# Patient Record
Sex: Female | Born: 1950 | Race: White | Marital: Married | Smoking: Never smoker
Health system: Northeastern US, Academic
[De-identification: ages and names within clinical notes are randomized; demographics above are authoritative.]

---

## 1998-04-07 DIAGNOSIS — C50919 Malignant neoplasm of unspecified site of unspecified female breast: Secondary | ICD-10-CM

## 1998-04-07 HISTORY — DX: Malignant neoplasm of unspecified site of unspecified female breast: C50.919

## 1998-04-07 HISTORY — PX: MASTECTOMY, MODIFIED RADICAL: SHX708

## 1998-04-07 HISTORY — PX: OTHER SURGICAL HISTORY: SHX169

## 2011-07-07 DIAGNOSIS — M069 Rheumatoid arthritis, unspecified: Secondary | ICD-10-CM

## 2011-07-07 HISTORY — DX: Rheumatoid arthritis, unspecified: M06.9

## 2011-07-28 DIAGNOSIS — M25469 Effusion, unspecified knee: Secondary | ICD-10-CM | POA: Insufficient documentation

## 2011-08-11 DIAGNOSIS — M069 Rheumatoid arthritis, unspecified: Secondary | ICD-10-CM | POA: Insufficient documentation

## 2011-08-11 DIAGNOSIS — Z92241 Personal history of systemic steroid therapy: Secondary | ICD-10-CM | POA: Insufficient documentation

## 2011-08-11 DIAGNOSIS — M1991 Primary osteoarthritis, unspecified site: Secondary | ICD-10-CM | POA: Insufficient documentation

## 2011-10-13 DIAGNOSIS — R69 Illness, unspecified: Secondary | ICD-10-CM | POA: Insufficient documentation

## 2011-10-13 DIAGNOSIS — M2550 Pain in unspecified joint: Secondary | ICD-10-CM | POA: Insufficient documentation

## 2011-11-17 DIAGNOSIS — R5382 Chronic fatigue, unspecified: Secondary | ICD-10-CM | POA: Insufficient documentation

## 2012-04-07 HISTORY — PX: KNEE ARTHROSCOPY: SHX127

## 2012-11-15 DIAGNOSIS — M810 Age-related osteoporosis without current pathological fracture: Secondary | ICD-10-CM | POA: Insufficient documentation

## 2015-07-16 ENCOUNTER — Encounter: Payer: Self-pay | Admitting: Podiatry

## 2015-07-16 ENCOUNTER — Ambulatory Visit: Payer: Self-pay | Admitting: Podiatry

## 2015-07-16 VITALS — Ht 67.0 in | Wt 150.0 lb

## 2015-07-16 DIAGNOSIS — M21611 Bunion of right foot: Secondary | ICD-10-CM

## 2015-07-16 DIAGNOSIS — M069 Rheumatoid arthritis, unspecified: Secondary | ICD-10-CM

## 2015-07-16 DIAGNOSIS — M2041 Other hammer toe(s) (acquired), right foot: Secondary | ICD-10-CM

## 2015-07-17 ENCOUNTER — Telehealth: Payer: Self-pay | Admitting: Podiatry

## 2015-07-17 NOTE — Telephone Encounter (Signed)
Please return patient call - she phoned to let you know she has cleared it with her other dr's that if she stops her medication for 1 week before surgery she is fine. Patient wants surgery 08/01/15. She would like you to confirm that date is still good so she can make plans for meds.  Thanks - jd

## 2015-07-18 ENCOUNTER — Ambulatory Visit: Payer: Self-pay | Admitting: Podiatry

## 2015-07-19 ENCOUNTER — Encounter: Payer: Self-pay | Admitting: Podiatry

## 2015-07-19 NOTE — Procedures (Signed)
Bone density appears within normal limits.  There is evidence of a bunion deformity with increased IM angle and lateral deviation of the hallux.  There are no significant arthritic changes noted in this area at this time.  There is no evidence of erosions noted of the lesser metatarsophalangeal joints consistent with rheumatoid arthritis.  The fifth toe is medially deviated with a partially subluxed PIPJ.  There is evidence of inferior and retrocalcaneal heel spur formation.

## 2015-07-19 NOTE — Progress Notes (Signed)
Subjective:  Patient presents today for surgical consultation for correction of right foot bunion.  Patient states she did have a flexor tenotomy of the second toe which did allow moderate improvement of the deformity.  Patient has a history of rheumatoid arthritis which she states has been in remission.  She is a retired Pharmacist, hospital.  She is very active and enjoys gardening and walking.     Chief Complaint   Patient presents with    New Patient Visit     Right foot bunion and 2nd hammertoe. Saw a previous podiatrist and had a 2nd toe tenotomy done this past january.        Patient ID: Terry Harrison is a 65 y.o. female.  There is no problem list on file for this patient.    Current Outpatient Prescriptions   Medication    Calcium Carbonate Antacid 600 MG chewable tablet    cholecalciferol (VITAMIN D) 1000 UNIT tablet    Strontium Chloride POWD    phytonadione (MEPHYTON) 5 MG tablet    XELJANZ 5 MG tablet     No current facility-administered medications for this visit.      Allergy History as of 07/19/15      No Known Allergies (drug, envir, food or latex)                HPI  Patient's medications, allergies, past medical, surgical, social and family histories were reviewed and updated as appropriate.          Objective:   Physical Exam    Patient appears well.  There are no apparent signs of distress.  The patient is awake and alert.      Vasc:    DP: 2/4, PT: 2/4 bilaterally.  Skin temperature, color & texture are within normal limits.   There are no varicosities, edema, or atrophic changes noted.   Positive digital hair growth is noted bilaterally.  Capillary filling time is less than 3 seconds bilaterally.     Derm:  There is no evidence of onychomycosis or ingrowing toenails. There is no evidence of tinea pedis infection. There are no hyperkeratotic lesions or evidence of eczema.    Ortho:  There is evidence of a bunion deformity that is painful over the right foot with residual contracture of the second toe.   There is contracture of the fifth toe as well.      Neuro:    Light touch & vibratory sensation are fully intact.   Deep tendon reflexes are normal bilaterally.  Proprioception is within normal limits bilaterally.        Assessment:      1. Rheumatoid arthritis  * Foot RIGHT standard AP, Lateral, Oblique views    * Foot RIGHT standard AP, Lateral, Oblique views   2. Hammer toe of right foot     3. Bunion of right foot            Plan:  1.The patient will need to discuss her medications perioperatively with her rheumatologist.  I did explain that while bunion corrections are successful and patient for rheumatoid arthritis, if she has a significant flare there may be changes in her foot after her surgery.    2. I discussed irregularity of the PIPJ of the second toe and would recommend an arthroplasty with K wire fixation.    3.  Today we discussed the Austin bunionectomy with screw fixation.  We discussed the actual anesthesia, procedure and postoperative course.  She'll be contacted  by the scheduling nurse to discuss possible dates.

## 2015-07-30 ENCOUNTER — Encounter: Payer: Self-pay | Admitting: Podiatry

## 2015-07-30 ENCOUNTER — Ambulatory Visit: Payer: Self-pay | Admitting: Podiatry

## 2015-07-30 VITALS — Ht 67.0 in | Wt 150.0 lb

## 2015-07-30 DIAGNOSIS — M2041 Other hammer toe(s) (acquired), right foot: Secondary | ICD-10-CM

## 2015-07-30 DIAGNOSIS — M21611 Bunion of right foot: Secondary | ICD-10-CM

## 2015-07-30 DIAGNOSIS — M069 Rheumatoid arthritis, unspecified: Secondary | ICD-10-CM

## 2015-07-30 MED ORDER — HYDROCODONE-ACETAMINOPHEN 7.5-325 MG PO TABS *I*
1.0000 | ORAL_TABLET | Freq: Four times a day (QID) | ORAL | 0 refills | Status: DC | PRN
Start: 2015-07-30 — End: 2016-02-26

## 2015-07-31 ENCOUNTER — Encounter: Payer: Self-pay | Admitting: Podiatry

## 2015-07-31 NOTE — Anesthesia Preprocedure Evaluation (Addendum)
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    By Chapman Fitch, MD at 7:52 AM on 07/31/2015    <[facility]>  Anesthesia Evaluation Information Source: records, patient, family     ANESTHESIA  Pertinent(-):  history of anesthetic complications, Family Hx of Anesthetic Complications    GENERAL     Denies general issues  Pertinent (-):  history of anesthetic complications, Family Hx of Anesthetic Complications    HEENT    + Corrective Eyewear            glasses  Pertinent (-):   hearing loss, TMJD, neck pain PULMONARY  Pertinent(-): smoking, recent URI, COPD    CARDIOVASCULAR  Good(4+METs) Exercise Tolerance    GI/HEPATIC/RENAL  Last PO Intake: >8hr before procedure    + Alcohol use            social  Pertinent(-):  GERD NEURO/PSYCH  Pertinent(-):  headaches, seizures    ENDO/OTHER     Denies endo issues    HEMALOGIC    + Autoimmune Disease            rheumatoid arthritis    + Arthritis            hands, ankle/feet and knees  Pertinent(-):  coagulopathy (vit K deficiency)       Physical Exam    Airway            Mouth opening: normal            Mallampati: I            TM distance (fb): >3 FB            Neck ROM: full  Dental        Cardiovascular           Rhythm: regular           Rate: normal       Pulmonary     breath sounds clear to auscultation    Mental Status     oriented to person, place and time       ________________________________________________________________________  Plan  ASA Score  2  Anesthetic Plan general    Induction (routine IV); General Anesthesia/Sedation Maintenance  Plan (propofol infusion); Airway (nasal cannula); Line ( use current access); Monitoring (standard ASA); Positioning (supine); PONV Plan (ondansetron); Pain (per surgical team and intraop local); PostOp (PACU)    Informed Consent     Risks:          Risks discussed were commensurate with the plan listed above with the following specific points: N/V, sore throat and hypotension , damage to:(eyes, nerves, teeth, blood vessels), allergic Rx, unexpected serious injury    Anesthetic Consent:      Anesthetic plan (and risks as noted above) were discussed with patient and spouse    Plan also discussed with team members including:  CRNA and surgeon    Attending Attestation:  As the primary attending anesthesiologist, I attest that the patient or proxy understands and accepts the risks and benefits of the anesthesia plan. I also attest that I have personally performed a pre-anesthetic examination and evaluation, and  prescribed the anesthetic plan for this particular location within 48 hours prior to the anesthetic as documented. Raynelle Dick, MD 7:25 AM

## 2015-07-31 NOTE — Progress Notes (Signed)
Subjective:  Patient is in today for preoperative visit.  She is scheduled for right foot Austin bunionectomy and second toe arthroplasty.  Patient has discussed procedure with her rheumatologist and has held her medications and has an date to resume after surgery.     Chief Complaint   Patient presents with    Pre-op Exam     right foot Austin and 2nd hammertoe correction.        Patient ID: Terry Harrison is a 65 y.o. female.  There is no problem list on file for this patient.    Current Outpatient Prescriptions   Medication    XELJANZ 5 MG tablet    Calcium Carbonate Antacid 600 MG chewable tablet    cholecalciferol (VITAMIN D) 1000 UNIT tablet    Strontium Chloride POWD    phytonadione (MEPHYTON) 5 MG tablet    NONFORMULARY, OTHER, ORDER *I*    Vitamin K, Phytonadione, 100 MCG TABS    TURMERIC CURCUMIN PO    HYDROcodone-acetaminophen (NORCO) 7.5-325 MG per tablet     No current facility-administered medications for this visit.      Allergy History as of 07/19/15      No Known Allergies (drug, envir, food or latex)                HPI  Patient's medications, allergies, past medical, surgical, social and family histories were reviewed and updated as appropriate.          Objective:   Physical Exam    Patient appears well.  There are no apparent signs of distress.  The patient is awake and alert.      Vasc:    DP: 2/4, PT: 2/4 bilaterally.  Skin temperature, color & texture are within normal limits.   There are no varicosities, edema, or atrophic changes noted.   Positive digital hair growth is noted bilaterally.  Capillary filling time is less than 3 seconds bilaterally.     Derm:  There is no evidence of onychomycosis or ingrowing toenails. There is no evidence of tinea pedis infection. There are no hyperkeratotic lesions or evidence of eczema.    Ortho:  There is evidence of a bunion deformity that is painful over the right foot with residual contracture of the second toe.  There is contracture of the  fifth toe as well. The fifth toe is asymptomatic at this time.    Neuro:    Light touch & vibratory sensation are fully intact.   Deep tendon reflexes are normal bilaterally.  Proprioception is within normal limits bilaterally.        Assessment:      1. Rheumatoid arthritis, involving unspecified site, unspecified rheumatoid factor presence     2. Hammer toe of right foot     3. Bunion of right foot            Plan:  1.The patient has discussed the surgical procedure with her rheumatologist and has discontinued her medications and is scheduled to resume medications after the procedure.  We did review with her rheumatoid arthritis that she may at some point have a flare of her arthritis which may cause changes in the correction.  This may require additional surgeries at some point in the future.      2. I discussed irregularity of the PIPJ of the second toe arthroplasty with K wire fixation.  We discussed the actual procedure, anesthesia and postoperative course as well as possible complications.  We discussed the possibility of  swelling and possible need for additional surgeries.    3.  Today we discussed the Austin bunionectomy with screw fixation.  We discussed the actual anesthesia, procedure, possible, locations and postoperative course.  We discussed the possibility of infection, delayed union, need for additional surgeries or physical therapy.  Her consent forms were read and signed today and copies were given to her.  We also discussed postoperative pain control and a prescription for hydrocodone was sent to her pharmacy.

## 2015-07-31 NOTE — OR PreOp (Signed)
[facility] Pre-operative Guidelines            1. Where will you be staying when you are discharged after surgery-home  2.  How will you be getting there-husband  3. Who will be taking care of you when you get there-husband    Day of surgery arrival Time:  The surgery center will call you the business day before your procedure between 2-6 pm.     Eating and drinking guidelines:  No solid foods after midnight the night before your procedure.  Adults-  May have clear liquids (water, soda or apple juice only) up to 4 hours prior to your arrival time and then nothing by mouth.  Pediatrics-  All but dental patients may have clear liquids (water, soda, or apple juice only) up to 3 hours before arrival time.   Dental patients are NPO after midnight.  Breast fed infants may have breast milk up to 4 hours before arrival.  Formula fed infants may have formula up to 6 hours before arrival.    Medications:  Medications to be taken on the day of surgery:  None    Items to bring on the day of surgery:  Insurance card, photo ID and healthcare proxy.  If you use a CPAP for sleep apnea, please bring your mask with you.  Your crutches, knee brace or arm sling. Please leave your crutches in the car.    Clothing, Jewelry, valuables and eyeglass case:  Wear loose fitting clothing that will fit over a bulky dressing.  Leave all jewelry and valuables at home.  Bring your eyeglasses and eyeglass case. You cannot wear contact lenses.     Pediatrics:  A parent or legal guardian must accompany and remain in the building at all times. We recommend that two adults accompany the patient home while riding in a car; one to drive the vehicle and one to assist with the care of the child.   Bring the childs favorite comfort item (i.e. blanket, doll or toy).    Surgeon Instructions:  Please read all pre-operative instructions carefully, and follow your surgeons guidelines if different from these instructions.

## 2015-08-01 ENCOUNTER — Encounter: Payer: Self-pay | Admitting: Podiatry

## 2015-08-01 ENCOUNTER — Encounter: Payer: Self-pay | Admitting: Anesthesiology

## 2015-08-01 ENCOUNTER — Encounter
Admission: RE | Disposition: A | Discharge status: Home / Self Care | Payer: Self-pay | Source: Ambulatory Visit | Attending: Podiatry

## 2015-08-01 ENCOUNTER — Ambulatory Visit
Admission: RE | Admit: 2015-08-01 | Discharge: 2015-08-01 | Disposition: A | Discharge status: Home / Self Care | Payer: Self-pay | Source: Ambulatory Visit | Attending: Podiatry | Admitting: Podiatry

## 2015-08-01 HISTORY — PX: PR CORRECTION HAMMERTOE: 28285

## 2015-08-01 HISTORY — PX: PR CORRJ HLX VLGS BNCTY SESMDC DSTL METAR OSTEOT: 28296

## 2015-08-01 SURGERY — BUNIONECTOMY, WITH CHEVRON OSTEOTOMY
Anesthesia: General | Site: Foot | Laterality: Right | Wound class: Clean

## 2015-08-01 MED ORDER — ONDANSETRON HCL 2 MG/ML IV SOLN *I*
INTRAMUSCULAR | Status: DC | PRN
Start: 2015-08-01 — End: 2015-08-01
  Administered 2015-08-01: 4 mg via INTRAMUSCULAR

## 2015-08-01 MED ORDER — HALOPERIDOL LACTATE 5 MG/ML IJ SOLN *I*
0.5000 mg | Freq: Once | INTRAMUSCULAR | Status: AC | PRN
Start: 2015-08-01 — End: 2015-08-01

## 2015-08-01 MED ORDER — BACITRACIN 50000 UNIT IM SOLR *I*
INTRAMUSCULAR | Status: AC
Start: 2015-08-01 — End: 2015-08-01
  Filled 2015-08-01: qty 10

## 2015-08-01 MED ORDER — MIDAZOLAM HCL 1 MG/ML IJ SOLN *I* WRAPPED
INTRAMUSCULAR | Status: AC
Start: 2015-08-01 — End: 2015-08-01
  Filled 2015-08-01: qty 2

## 2015-08-01 MED ORDER — LIDOCAINE HCL 1 % IJ SOLN *I*
0.1000 mL | Freq: Once | INTRAMUSCULAR | Status: AC | PRN
Start: 2015-08-01 — End: 2015-08-01

## 2015-08-01 MED ORDER — SODIUM CHLORIDE 0.9 % INJ (FLUSH) WRAPPED *I*
Status: AC
Start: 2015-08-01 — End: 2015-08-01
  Filled 2015-08-01: qty 20

## 2015-08-01 MED ORDER — LIDOCAINE HCL 2 % IJ SOLN *I*
INTRAMUSCULAR | Status: AC
Start: 2015-08-01 — End: 2015-08-01
  Filled 2015-08-01: qty 20

## 2015-08-01 MED ORDER — DEXAMETHASONE SODIUM PHOSPHATE 4 MG/ML INJ SOLN *WRAPPED*
INTRAMUSCULAR | Status: DC | PRN
Start: 2015-08-01 — End: 2015-08-01
  Administered 2015-08-01: 4 mg via INTRAMUSCULAR

## 2015-08-01 MED ORDER — KETOROLAC TROMETHAMINE 30 MG/ML IJ SOLN *I*
INTRAMUSCULAR | Status: DC | PRN
Start: 2015-08-01 — End: 2015-08-01
  Administered 2015-08-01: 30 mg via INTRAVENOUS

## 2015-08-01 MED ORDER — FENTANYL CITRATE 50 MCG/ML IJ SOLN *WRAPPED*
INTRAMUSCULAR | Status: DC | PRN
Start: 2015-08-01 — End: 2015-08-01
  Administered 2015-08-01 (×2): 50 ug via INTRAVENOUS

## 2015-08-01 MED ORDER — LACTATED RINGERS IV SOLN *I*
20.0000 mL/h | INTRAVENOUS | Status: DC
Start: 2015-08-01 — End: 2015-08-02

## 2015-08-01 MED ORDER — MIDAZOLAM HCL 1 MG/ML IJ SOLN *I* WRAPPED
INTRAMUSCULAR | Status: DC | PRN
Start: 2015-08-01 — End: 2015-08-01
  Administered 2015-08-01: 2 mg via INTRAVENOUS

## 2015-08-01 MED ORDER — BACITRACIN-POLYMYXIN B 500-10000 UNIT/GM EX OINT *I*
TOPICAL_OINTMENT | CUTANEOUS | Status: DC | PRN
Start: 2015-08-01 — End: 2015-08-01
  Administered 2015-08-01: 1 via TOPICAL

## 2015-08-01 MED ORDER — CEFAZOLIN SODIUM 1000 MG IJ SOLR *I*
2000.0000 mg | INTRAMUSCULAR | Status: AC
Start: 2015-08-01 — End: 2015-08-01
  Administered 2015-08-01: 2000 mg via INTRAVENOUS

## 2015-08-01 MED ORDER — LIDOCAINE HCL 2 % (PF) IJ SOLN *I*
INTRAMUSCULAR | Status: DC | PRN
Start: 2015-08-01 — End: 2015-08-01
  Administered 2015-08-01: 27 mL via SUBCUTANEOUS

## 2015-08-01 MED ORDER — BUPIVACAINE HCL 0.5 % IJ SOLUTION *WRAPPED*
INTRAMUSCULAR | Status: AC
Start: 2015-08-01 — End: 2015-08-01
  Filled 2015-08-01: qty 30

## 2015-08-01 MED ORDER — BACITRACIN-POLYMYXIN B 500-10000 UNIT/GM EX OINT *I*
TOPICAL_OINTMENT | CUTANEOUS | Status: AC
Start: 2015-08-01 — End: 2015-08-01
  Filled 2015-08-01: qty 1

## 2015-08-01 MED ORDER — PROPOFOL INFUSION 10 MG/ML *I*
INTRAVENOUS | Status: DC | PRN
Start: 2015-08-01 — End: 2015-08-01
  Administered 2015-08-01: 100 ug/kg/min via INTRAVENOUS
  Administered 2015-08-01: 75 ug/kg/min via INTRAVENOUS
  Administered 2015-08-01: 60 ug/kg/min via INTRAVENOUS
  Administered 2015-08-01: 30 ug/kg/min via INTRAVENOUS
  Administered 2015-08-01: 150 ug/kg/min via INTRAVENOUS

## 2015-08-01 MED ORDER — SODIUM CHLORIDE 0.9 % IV SOLN WRAPPED *I*
INTRAMUSCULAR | Status: DC | PRN
Start: 2015-08-01 — End: 2015-08-01
  Administered 2015-08-01: 500 mL

## 2015-08-01 MED ORDER — FENTANYL CITRATE 50 MCG/ML IJ SOLN *WRAPPED*
INTRAMUSCULAR | Status: AC
Start: 2015-08-01 — End: 2015-08-01
  Filled 2015-08-01: qty 2

## 2015-08-01 SURGICAL SUPPLY — 37 items
.045 GUIDEWIRE (Guidewire) ×4 IMPLANT
3.0 low profile screw (Screw) ×4 IMPLANT
BANDAGE ESMARK 4IN LF STER USE 219460 (Dressing) ×2 IMPLANT
BANDAGE KERLIX KLING 2IN X 4YD STER (Dressing) ×4 IMPLANT
BANDAGE KERLIX KLING 3IN X 4YD STER (Dressing) ×2 IMPLANT
BLADE AVERAGE MED. 25.0X9.0MM USE 22567 (Supply) ×2 IMPLANT
BLADE MINI RND SHARP ON ONE SIDE (Other) ×2 IMPLANT
BLADE SAG MICRO .22 X 5.5 X 25MM (Supply) IMPLANT
BLADE SAW RECIP RASP MICRO SMALL TEAR 11 X 5MM (Supply) IMPLANT
BLADE STRYKER MED.NAR 18.0X5.5 (Supply) ×2 IMPLANT
BLADE SURG CARBON STEEL #15 STER (Supply) ×6 IMPLANT
BLADE SURG CARBON STEEL #15 STER REUSE HNDL (Supply) ×3 IMPLANT
BUR OVAL 4.0 X 8 (Other)
BUR SURG M L4CM DIA4MM OVL STR NONFLUTED CUT (Other) IMPLANT
COVER PIN SM DIA0.28-0.62IN YEL THRMPLSTC TAPR FIT W/ EZ ON SL OXBORO (Supply) ×1 IMPLANT
COVER PIN SM YELLOW (Supply) ×1
CUFF TOURNIQUET SPSB PLC 18IN STER DISP (Supply) ×2 IMPLANT
DRESSING CURITY NONADHERE 3X3 (Dressing) ×2 IMPLANT
GLOVE SURG PROTEXIS PI CLASSIC 7.0 PF SYN (Glove) ×8 IMPLANT
GLOVE SURG PROTEXIS SYN SZ 7.5 BLUE PF LF (Glove) ×2 IMPLANT
GOWN SIRIUS RAGLAN NONREINFORCED XL (Gown) ×2 IMPLANT
NEEDLE HYPO BVL LF 25G X 1.5IN (Needle) ×6 IMPLANT
NEEDLE HYPO REG BVL 18G X 1.5IN PINK (Needle) ×6 IMPLANT
PACK CUSTOM MINOR FOOT ANKLE (Pack) ×2 IMPLANT
PADDING WEBRIL 4IN LF NONSTER (Dressing) ×2 IMPLANT
SLEEVE COMP KNEE HI MED (Supply) ×2 IMPLANT
SOL SOD CHL IRRIG 500ML BTL (Solution) ×2 IMPLANT
SPONGE GZE 4X4 STER 10IN S 12 (Dressing) ×2 IMPLANT
STOCKINETTE TBLR 6IN X 48IN STER (Supply) ×2 IMPLANT
SUTR ETHILON 5-0 PC/3 18IN BLACK (Suture) ×4 IMPLANT
SUTR VICRYL ANTIB 3-0 PS-2 27 UNDYED (Suture) ×2 IMPLANT
SUTURE VCRL + SZ 4-0 L18IN ABSRB UD PS-2 L19MM PRIM REV CUT NDL POLYGLACTIN 910 COAT (Suture) ×2 IMPLANT
SYRINGE 10CC LUERLOCK LF USE 221755 (Supply) ×6 IMPLANT
SYRINGE LUERLOCK 1CC LF (Supply) ×2 IMPLANT
SYRINGE LUERLOCK 3ML INDIVIDUAL WRAP (Supply) IMPLANT
TIP SUCT FRAZIER 12FR (Supply) IMPLANT
WIRE K DBL TROCAR END SHT SMTH .045X4IN (Implant) ×2 IMPLANT

## 2015-08-01 NOTE — Op Note (Signed)
Terry Harrison, Terry Harrison  MR #:  [id number]   ACCOUNT #:  [id number] DOB:  [birth year]    AGE:  64     SURGEON:  Fransico Meadow Sallye Lunz, DPM  CO-SURGEON:    ASSISTANT:  None.  SURGERY DATE:  08/01/2015    PREOPERATIVE DIAGNOSIS:  Right foot bunion and right 2nd hammertoe.    POSTOPERATIVE DIAGNOSIS:  Right foot bunion and right 2nd hammertoe.    OPERATIVE PROCEDURE:  Right foot Austin bunionectomy with screw fixation and right 2nd toe arthroplasty with K-wire fixation.    ANESTHESIA:  MAC.    PATHOLOGY:  None.    DESCRIPTION OF PROCEDURE:  The patient was brought into the operating room and placed in the supine position on the operating table.  The right ankle was wrapped several times with Webril and an ankle tourniquet was placed.  Local anesthesia consisted of a 50:50 mixture of 0.5 Marcaine and 2% lidocaine plain.  The foot was prepped and draped in normal sterile fashion.  The blood was exsanguinated using an Esmarch, and the ankle tourniquet was inflated to 250 mmHg for hemostasis.     Attention was then directed to the dorsomedial aspect of the right 1st metatarsophalangeal joint where a linear incision was made.  The incision was deepened in the same plane using blunt and sharp dissection.  All bony structures were identified and bleeders were coagulated as necessary.  The incision was deepened to the level of the capsule, which was reflected medially and laterally, revealing the bunion deformity.     Attention was then directed to the dorsal aspect where the extensor brevis tendon was identified and released.     Attention was then directed to the 1st interspace where the tight lateral strictures were identified and released.  The extensor tendon was identified and released.  At this time an excellent release was identified.     Attention was then directed back to the medial aspect of the metatarsal head where a portion of the medial eminence of resected.  Next, a K-wire was threaded into the metatarsal head to act  as an access guide.  A dorsal and plantar cut were made in the osteotomy fashion.  The K-wire was removed and the capital fragment was transferred laterally for reduction of the deformity.  Two K-wires were then driven across the osteotomy, countersunk, was measured, and 2 Arthrex 3.0 cannulated screws were driven across osteotomy.  Excellent fixation was noted at this time.  The K-wires were removed.  The redundant medial bone was resected as well as redundant medial capsule.  The area was rasped until smooth.  The area was then copiously flushed with normal saline.  The capsule was reapproximated using 3-0 Vicryl, the subcutaneous tissues using 4-0 Vicryl, and the skin was closed using 5-0 nylon.     Attention was then directed to a second toe where a linear incision was made extending from the metatarsophalangeal joint to the PIPJ.  This incision was deepened in the same plane using blunt and sharp dissection.  All vital structures were identified and bleeders were ligated as necessary.  The incision was deepened to the level of the extensor tendon which was transected and reflected proximally to the metatarsophalangeal joint where the tight dorsal and lateral capsule was released.     Next, the head of the proximal phalanx was resected and rasped until it was smooth.  The area was copiously flushed with normal saline.  Next, a 0.045 K-wire was driven  distal to the toe, then retrograded back across the proximal phalanx and the metatarsophalangeal joint.  The foot was loaded and the toe was noted to be in rectus position.  The K-wire was bent, cut, and capped.     Next, the extensor tendon lengthening procedure was performed, and the then the tendon was reapproximated using 4-0 Vicryl, the subcutaneous tissue using 4-0 Vicryl, and the skin was closed using 5-0 nylon.  A postoperative injection of Decadron was injected into the operative site.  The areas were covered with Betadine-soaked Adaptic, 4 x 4's, Kling,  and Coban.  Antibiotic ointment was also placed to this aspect of the K-wires.     The ankle tourniquet was released and immediate capillary refill was noted to all digits.     The patient tolerated the anesthesia and procedure well and left the operating room for the recovery room with vital signs stable and vascular status intact.             ______________________________  Terry Harrison, DPM    DEH/MODL  DD:  08/01/2015 13:35:46  DT:  08/01/2015 14:29:54  Job #:  [id number]    cc:

## 2015-08-01 NOTE — Anesthesia Procedure Notes (Addendum)
---------------------------------------------------------------------------------------------------------------------------------------    AIRWAY   GENERAL INFORMATION AND STAFF    Patient location during procedure: OR       Date of Procedure: 08/01/2015 8:13 AM  CONDITION PRIOR TO MANIPULATION     Current Airway/Neck Condition:  Normal        For more airway physical exam details, see Anesthesia PreOp Evaluation  AIRWAY METHOD     Patient Position:  Sniffing    Preoxygenated: yes      Induction: IV    Mask Difficulty Assessment:  1 - vent by mask       Difficult mask ventilation scale: during initial minute of induction, applied BVM for a few breaths     Number of Attempts at Approach:  1  FINAL AIRWAY DETAILS    Final Airway Type:  Nasal cannula    Adjunct Airway: oropharyngeal airway (OPA)    OPA Size:  1mm    Head position required to avoid obstruction:  Turned to right  ----------------------------------------------------------------------------------------------------------------------------------------

## 2015-08-01 NOTE — Anesthesia Case Conclusion (Signed)
CASE CONCLUSION  Emergence  Actions:  Not emerged  Criteria Used for Airway Removal:  Adequate Tv & RR, acceptable O2 saturation and following commands  Assessment:  Routine  Transport  Directly to: PACU  Airway:  Nasal cannula  Oxygen Delivery:  2 lpm  Position:  Recumbent  Patient Condition on Handoff  Level of Consciousness:  Alert/talking/calm  Patient Condition:  Stable  Handoff Report to:  RN

## 2015-08-01 NOTE — Progress Notes (Signed)
Right foot drainage shown to Dr, Dellia Nims, reinforced with 2x2's and kerlex as directed. Patient tolerated. Denies pain. Good cap refill <3 sec. in right foot. Patient and husband received discharge teaching. No further questions at this time. Stockingette and surgical shoe placed to right foot. Patient and husband stated comfort with discharge. Smiling and thankful for care.

## 2015-08-01 NOTE — INTERIM OP NOTE (Signed)
Interim Op Note (Surgical Log ID: [id number])       Date of Surgery: 08/01/2015       Surgeons: Surgeon(s) and Role:     * Jennifermarie Franzen, Shanon Brow, DPM - Primary       Pre-op Diagnosis: Pre-Op Diagnosis Codes:     * Hallux valgus with bunions of right foot [M20.11, M21.611]     * Hammer toe of second toe of right foot [M20.41]       Post-op Diagnosis: Post-Op Diagnosis Codes:     * Hallux valgus with bunions of right foot [M20.11, M21.611]     * Hammer toe of second toe of right foot [M20.41]       Procedure(s) Performed: Procedure:    CHEVRON BUNIONECTOMY  CPT(R) Code:  [id number] - PR CORRECT BUNION,METATARSAL OSTEOTOMY    Procedure:    TOE HAMMERTOE CORRECTION 2nd toe  CPT(R) Code:  [id number] - PR REPAIR OF HAMMERTOE,ONE         Additional CPT Codes:        Anesthesia Type: MAC       Fluid Totals: I/O this shift:  04/26 0700 - 04/26 1459  In: 500 (7.1 mL/kg) [I.V.:500]  Out: - (0 mL/kg)   Net: 500  Weight: 70.5 kg        Estimated Blood Loss: No Data Recorded       Specimens to Pathology:  * No specimens in log *       Temporary Implants: Wire K Dbl Trocar End Sht Smth .045x4in; Removal Plan: K-Wire To Be Removed In Doctors Office; (Anticipated Removal Date:08/22/2015);            Packing:                 Patient Condition: good       Findings (Including unexpected complications): Right foot bunion and 2nd hammertoe     Signed:  Jolee Ewing, DPM  on 08/01/2015 at 9:16 AM

## 2015-08-01 NOTE — Anesthesia Postprocedure Evaluation (Signed)
Anesthesia Post-Op Note    Patient: Terry Harrison    Procedure(s) Performed:  Procedure Summary     Date Anesthesia Start Anesthesia Stop Room / Location    08/01/15 [phone] [id number]       Procedure Diagnosis Surgeon Attending Anesthesia    CHEVRON BUNIONECTOMY (Right Foot); TOE HAMMERTOE CORRECTION 2nd toe (Right Foot) Hallux valgus with bunions of right foot; Hammer toe of second toe of right foot  (Hallux valgus with bunions of right foot [M20.11, M21.611]; Hammer toe of second toe of right foot [M20.41]) High, Shanon Brow, DPM Raynelle Dick, MD        Recovery Vitals  BP: 120/74 (08/01/2015  9:30 AM)  Heart Rate: 63 (08/01/2015  9:30 AM)  Resp: 16 (08/01/2015  9:30 AM)  Temp: 36.1 C (97 F) (08/01/2015  9:12 AM)  SpO2: 97 % (08/01/2015  9:30 AM)  O2 Device: None (Room air) (08/01/2015  9:30 AM)  O2 Flow Rate: 2 L/min (08/01/2015  9:12 AM)   0-10 Scale: 0 (08/01/2015  9:30 AM)  Anesthesia type:  General  Complications Noted During Procedure or in PACU:  None   Comment:    Patient Location:  PACU  Level of Consciousness:    Recovered to baseline, awake, oriented and alert  Patient Participation:     Able to participate  Temperature Status:    Normothermic  Oxygen Saturation:    Within patient's normal range  Cardiac Status:   Within patient's normal range  Fluid Status:    Stable  Pulmonary Status:    Baseline  Pain Management:    Adequate analgesia  Nausea and Vomiting:  None    Post Op Assessment:    Tolerated procedure well   Attending Attestation:  All indicated post anesthesia care provided     -

## 2015-08-01 NOTE — H&P (View-Only) (Signed)
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    By Chapman Fitch, MD at 7:52 AM on 07/31/2015    <[facility]>  Anesthesia Evaluation Information Source: records                      GI/HEPATIC/RENAL  Last PO Intake: Enter Last PO Intake in ROS/Med Hx Tab         HEMALOGIC    + Coagulopathy (vit K deficiency)    + Autoimmune Disease            rheumatoid arthritis    + Arthritis       Physical Exam Not Completed________________________________________________________________________  Plan      ; General Anesthesia/Sedation Maintenance Plan (propofol infusion); Airway (nasal cannula); Line ( use current access); Monitoring (standard ASA); Positioning (supine); PONV Plan (ondansetron); Pain (per surgical team and intraop local); PostOp (PACU)    Anesthesia Consent Not Performed

## 2015-08-01 NOTE — Interval H&P Note (Signed)
UPDATES TO PATIENT'S CONDITION on the DAY OF SURGERY/PROCEDURE    I. Updates to Patient's Condition (to be completed by a provider privileged to complete a H&P, following reassessment of the patient by the provider):    Day of Surgery/Procedure Update:  History  History reviewed and no change    Physical  Physical exam updated and significant changes noted: Patient was seen today for bunion and 2nd hammertoe correction of the right foot.  She has stopped her meds for RA.  She understands the procedure, anesthesia, post op course as well as possible complications.             II. Procedure Readiness   I have reviewed the patient's H&P and updated condition. By completing and signing this form, I attest that this patient is ready for surgery/procedure.    III. Attestation   I have reviewed the updated information regarding the patient's condition and it is appropriate to proceed with the planned surgery/procedure.    Jolee Ewing, DPM as of 7:19 AM 08/01/2015

## 2015-08-01 NOTE — Discharge Instructions (Addendum)
Post-Operative Instructions:    ? Have prescriptions filled immediately.  ? Take medications as directed.  ? Keep the operative foot elevated above the level of your heart for the first 48 hours whenever possible.  ? Limit all walking and standing.  ? Use surgical shoe as directed.  ? Ice bag to operative ankle 20 minutes on, 10 minutes off.  Put behind knee if cast has been applied.  ? Check capillary return to all digits of operative foot every hour when awake.  ? Keep bandage clean, dry, and intact.  (do not remove soiled or wet bandage.      Call Doctor Immediately If You Notice:    ? Increase in temperature. (oral or local temperature of the foot or ankle)  ? Red streaks up the leg.  ? Severe pain or throbbing.  ? White or purple color of toes.  ? Delayed or no capillary return to digits of the operative foot  ? Excessive bleeding through the bandage.  ? Your bandage has fallen off.      Your next appointment is  08/06/15    at 11:30am    at the [facility]  location.                   Emergency phone number is [phone]    Due to the sedation medication and or general anesthesia you have been given today, please follow these discharge instructions:    [x]  Do not drive or operate any machinery for 24 hours or the specified time frame that was recommended by your doctor, please refer to post op instructions.  [x]  Do not drink any alcoholic beverages for 24 hours after your procedure and/or if you are taking a narcotic pain reliever (e.g. Vicodin, Percocet or Tylenol #3).  [x]  Do not make any major decisions or sign contracts for 24 hours.    Diet: begin with liquids, advance as tolerated.    Your last pain medication was given to you at: n/a    Your next dose of pain medication is due after: when needed    If unable to reach your doctor at [phone], call 911 for a true emergency or go to your nearest emergency department.    It is our recommendation that you have someone stay with you for 24 hours  following your procedure.

## 2015-08-03 ENCOUNTER — Encounter: Payer: Self-pay | Admitting: Podiatry

## 2015-08-06 ENCOUNTER — Encounter: Payer: Self-pay | Admitting: Podiatry

## 2015-08-06 ENCOUNTER — Ambulatory Visit: Payer: Self-pay | Admitting: Podiatry

## 2015-08-06 VITALS — Ht 66.5 in | Wt 150.0 lb

## 2015-08-06 DIAGNOSIS — M21619 Bunion of unspecified foot: Secondary | ICD-10-CM

## 2015-08-06 DIAGNOSIS — M2041 Other hammer toe(s) (acquired), right foot: Secondary | ICD-10-CM

## 2015-08-06 DIAGNOSIS — M069 Rheumatoid arthritis, unspecified: Secondary | ICD-10-CM

## 2015-08-06 DIAGNOSIS — M21611 Bunion of right foot: Secondary | ICD-10-CM

## 2015-08-06 NOTE — Progress Notes (Signed)
Subjective:  Patient is in today for first postoperative visit.  She is status post right foot Austin bunionectomy with screw fixation and second toe arthroplasty with K wire fixation.  Overall she states she's doing very well with minimal discomfort.     Chief Complaint   Patient presents with    Post Operative Protocol     Right foot.        Patient ID: Terry Harrison is a 65 y.o. female.  There is no problem list on file for this patient.    Current Outpatient Prescriptions   Medication    NONFORMULARY, OTHER, ORDER *I*    Vitamin K, Phytonadione, 100 MCG TABS    TURMERIC CURCUMIN PO    HYDROcodone-acetaminophen (NORCO) 7.5-325 MG per tablet    XELJANZ 5 MG tablet    Calcium Carbonate Antacid 600 MG chewable tablet    cholecalciferol (VITAMIN D) 1000 UNIT tablet    Strontium Chloride POWD    phytonadione (MEPHYTON) 5 MG tablet     No current facility-administered medications for this visit.      Allergy History as of 07/19/15      No Known Allergies (drug, envir, food or latex)                HPI  Patient's medications, allergies, past medical, surgical, social and family histories were reviewed and updated as appropriate.          Objective:   Physical Exam    Patient appears well.  There are no apparent signs of distress.  The patient is awake and alert.      Vasc:    DP: 2/4, PT: 2/4 bilaterally.  Skin temperature, color & texture are within normal limits.   There are no varicosities, edema, or atrophic changes noted.   Positive digital hair growth is noted bilaterally.  Capillary filling time is less than 3 seconds bilaterally.   Patient has normal postoperative edema  Derm:  Skin edges are well coapted with no signs of dehiscence or infection    Ortho:  There is evidence of an Austin bunionectomy with toe in rectus position and reduction of the deformity.  There is evidence of a second hammertoe correction with K wire fixation with toe in rectus position.  Neuro:    Light touch & vibratory sensation  are fully intact.   Deep tendon reflexes are normal bilaterally.  Proprioception is within normal limits bilaterally.        Assessment:      1. Bunion  * Foot RIGHT standard AP, Lateral, Oblique views    * Foot RIGHT standard AP, Lateral, Oblique views   2. Rheumatoid arthritis, involving unspecified site, unspecified rheumatoid factor presence     3. Hammer toe of right foot     4. Bunion of right foot         Radiographs were reviewed today with the patient.  A new dry dressing was applied today.  Antibiotic ointment was applied to the K wire.  Patient will continue with surgical shoe and elevation.  Patient requested a second surgical shoe to utilize for outdoors due to the brain.  She'll be seen next week for removal of sutures or call she has any problems or questions before that time.  Plan:     Ortho Podiatry DME (Select all that apply):      Post-op Shoe Square Toe [phone])      Frequency/Duration of use (Select all that apply):  [x]   (24)  hours, (7) days per week  [x]   With ADLs  [x]   Until next follow-up appointment with physician  []   Other:      Physician note -   The above orthosis is required for the following reasons:   The patient is ambulatory but has weakness and instability of their right foot/ankle which requires stabilization from this rigid/semi-rigid orthosis to improve function.      [x]   Ambulatory patient  [x]   Weakness or deformity of the foot and ankle  [x]   Requires stabilization for medical reasons  [x]   Have the potential to benefit functionally    Verbal and written instructions for the use and application of this item were given. The patient was instructed that should the brace result in increased pain, decreased sensation, increased swelling or an overall worsening of their medical condition, to please contact our office immediately.    Additional notes (Select all that apply):  []   Patient has has a same or similar brace in the past (X) years  []   Brace was lost or stolen  []    Patient gained or lost a significant amount of weight  []   Brace is broken beyond repair and no longer functional  []   Brace was destroyed in a fire or a natural disaster  []   Brace does not support the level of stability required by the injury  []   Other:      Additional comments:

## 2015-08-09 ENCOUNTER — Encounter: Payer: Self-pay | Admitting: Podiatry

## 2015-08-09 NOTE — Procedures (Signed)
There is evidence of an Ship broker with 2 screw fixation.  Metatarsophalangeal joint is congruous and sesamoids are in proper position.  Internal fixation is stable and intact.  There is evidence of a second toe arthroplasty with K wire fixation with toe in rectus position.

## 2015-08-17 ENCOUNTER — Ambulatory Visit: Payer: Self-pay | Admitting: Podiatry

## 2015-08-17 ENCOUNTER — Encounter: Payer: Self-pay | Admitting: Podiatry

## 2015-08-17 VITALS — Ht 67.0 in | Wt 150.0 lb

## 2015-08-17 DIAGNOSIS — M21619 Bunion of unspecified foot: Secondary | ICD-10-CM

## 2015-08-17 DIAGNOSIS — M2041 Other hammer toe(s) (acquired), right foot: Secondary | ICD-10-CM

## 2015-08-20 ENCOUNTER — Encounter: Payer: Self-pay | Admitting: Podiatry

## 2015-08-20 NOTE — Progress Notes (Signed)
Subjective:  Patient is in today for second postoperative visit.  She is status post right foot Austin bunionectomy with screw fixation and second toe arthroplasty with K wire fixation.  Overall she states she's doing very well with minimal discomfort.     Chief Complaint   Patient presents with    Post Operative Protocol     Right foot bunion.        Patient ID: Terry Harrison is a 65 y.o. female.  There is no problem list on file for this patient.    Current Outpatient Prescriptions   Medication    Calcium Carbonate (CALCIUM 600 PO)    NONFORMULARY, OTHER, ORDER *I*    Vitamin K, Phytonadione, 100 MCG TABS    TURMERIC CURCUMIN PO    XELJANZ 5 MG tablet    cholecalciferol (VITAMIN D) 1000 UNIT tablet    HYDROcodone-acetaminophen (NORCO) 7.5-325 MG per tablet     No current facility-administered medications for this visit.      Allergy History as of 07/19/15      No Known Allergies (drug, envir, food or latex)                HPI  Patient's medications, allergies, past medical, surgical, social and family histories were reviewed and updated as appropriate.          Objective:   Physical Exam    Patient appears well.  There are no apparent signs of distress.  The patient is awake and alert.      Vasc:    DP: 2/4, PT: 2/4 bilaterally.  Skin temperature, color & texture are within normal limits.   There are no varicosities, edema, or atrophic changes noted.   Positive digital hair growth is noted bilaterally.  Capillary filling time is less than 3 seconds bilaterally.   Patient has normal postoperative edema  Derm:  Skin edges are well coapted with no signs of dehiscence or infection    Ortho:  There is evidence of an Austin bunionectomy with toe in rectus position and reduction of the deformity.  There is evidence of a second hammertoe correction with K wire fixation with toe in rectus position.  Neuro:    Light touch & vibratory sensation are fully intact.   Deep tendon reflexes are normal  bilaterally.  Proprioception is within normal limits bilaterally.        Assessment:      1. Bunion     2. Hammer toe of right foot         Patient's sutures were cleaned today were peroxide and removed.  The patient's K wire was removed today as well.  Patient was given instructions to start with massage and range of motion exercises.  She'll continue with her surgical shoe and follow-up as scheduled in 2 weeks or call she has any problems or questions before that time.

## 2015-08-31 ENCOUNTER — Encounter: Payer: Self-pay | Admitting: Podiatry

## 2015-08-31 ENCOUNTER — Ambulatory Visit: Payer: Self-pay | Admitting: Podiatry

## 2015-08-31 VITALS — Ht 67.0 in | Wt 150.0 lb

## 2015-08-31 DIAGNOSIS — M2041 Other hammer toe(s) (acquired), right foot: Secondary | ICD-10-CM

## 2015-08-31 DIAGNOSIS — M21611 Bunion of right foot: Secondary | ICD-10-CM

## 2015-09-03 ENCOUNTER — Encounter: Payer: Self-pay | Admitting: Podiatry

## 2015-09-03 NOTE — Procedures (Signed)
There is evidence of a previous Austin-type bunionectomy with 2 screw fixation.  There is reduction of deformity with metatarsophalangeal joint incongruence and sesamoids in proper position.  There is evidence of healing across the osteotomy with internal fixation intact and stable.  There is also evidence of a second toe arthroplasty with toe in rectus position.

## 2015-09-03 NOTE — Progress Notes (Signed)
Subjective:  Patient is in today for third postoperative visit.  She is status post right foot Austin bunionectomy with screw fixation and second toe arthroplasty with K wire fixation.  Overall she states she's doing very well with minimal discomfort.     Chief Complaint   Patient presents with    Post Operative Protocol     Right foot bunion.        Patient ID: Terry Harrison is a 65 y.o. female.  There is no problem list on file for this patient.    Current Outpatient Prescriptions   Medication    Calcium Carbonate (CALCIUM 600 PO)    NONFORMULARY, OTHER, ORDER *I*    Vitamin K, Phytonadione, 100 MCG TABS    TURMERIC CURCUMIN PO    HYDROcodone-acetaminophen (NORCO) 7.5-325 MG per tablet    XELJANZ 5 MG tablet    cholecalciferol (VITAMIN D) 1000 UNIT tablet     No current facility-administered medications for this visit.      Allergy History as of 07/19/15      No Known Allergies (drug, envir, food or latex)                HPI  Patient's medications, allergies, past medical, surgical, social and family histories were reviewed and updated as appropriate.          Objective:   Physical Exam    Patient appears well.  There are no apparent signs of distress.  The patient is awake and alert.      Vasc:    DP: 2/4, PT: 2/4 bilaterally.  Skin temperature, color & texture are within normal limits.   There are no varicosities, edema, or atrophic changes noted.   Positive digital hair growth is noted bilaterally.  Capillary filling time is less than 3 seconds bilaterally.   Patient has normal postoperative edema  Derm:  Skin edges are well coapted with no signs of dehiscence or infection    Ortho:  There is evidence of an Austin bunionectomy with toe in rectus position and reduction of the Hammertoe deformity.       Neuro:    Light touch & vibratory sensation are fully intact.   Deep tendon reflexes are normal bilaterally.  Proprioception is within normal limits bilaterally.        Assessment:      1. Bunion of right  foot  * Foot RIGHT standard AP, Lateral, Oblique views    * Foot RIGHT standard AP, Lateral, Oblique views   2. Hammer toe of right foot  * Foot RIGHT standard AP, Lateral, Oblique views    * Foot RIGHT standard AP, Lateral, Oblique views       The radiographs were reviewed showing the osteotomy for the bunion is healing well.  Patient was given Coban and a this point the patient may begin and tolerating a regular shoes.  She will continue with massage and follow-up in approximate 4 weeks.

## 2015-09-07 ENCOUNTER — Ambulatory Visit: Payer: Self-pay | Admitting: Podiatry

## 2015-10-01 ENCOUNTER — Ambulatory Visit: Payer: Self-pay | Admitting: Podiatry

## 2015-11-22 ENCOUNTER — Ambulatory Visit: Payer: Self-pay | Admitting: Podiatry

## 2015-11-22 ENCOUNTER — Encounter: Payer: Self-pay | Admitting: Podiatry

## 2015-11-22 VITALS — Ht 67.0 in | Wt 150.0 lb

## 2015-11-22 DIAGNOSIS — M2041 Other hammer toe(s) (acquired), right foot: Secondary | ICD-10-CM

## 2015-11-22 DIAGNOSIS — M21611 Bunion of right foot: Secondary | ICD-10-CM

## 2015-11-22 DIAGNOSIS — M79672 Pain in left foot: Secondary | ICD-10-CM

## 2015-11-22 DIAGNOSIS — M2042 Other hammer toe(s) (acquired), left foot: Secondary | ICD-10-CM

## 2015-11-22 MED ORDER — METHYLPREDNISOLONE ACETATE 40 MG/ML IJ SUSP *I*
10.0000 mg | Freq: Once | INTRAMUSCULAR | Status: AC
Start: 2015-11-22 — End: 2015-11-22

## 2015-11-22 MED ORDER — LIDOCAINE-EPINEPHRINE 2 %-1:100000 IJ SOLN *I*
0.7500 mL | Freq: Once | INTRAMUSCULAR | Status: AC
Start: 2015-11-22 — End: 2015-11-22

## 2015-11-22 MED ORDER — DEXAMETHASONE SODIUM PHOSPHATE 4 MG/ML INJ SOLN *WRAPPED*
3.0000 mg | Freq: Once | INTRAMUSCULAR | Status: AC
Start: 2015-11-22 — End: 2015-11-22

## 2015-11-25 ENCOUNTER — Encounter: Payer: Self-pay | Admitting: Podiatry

## 2015-11-25 NOTE — Progress Notes (Signed)
Subjective:  The patient is in today for postop visit of her right foot as well as swelling in her left foot.     Chief Complaint   Patient presents with    Foot Pain     follow up right foot,  doing well.  left 2nd toe swollen        Patient ID: Terry Harrison is a 65 y.o. female.  There is no problem list on file for this patient.    Current Outpatient Prescriptions   Medication    Calcium Carbonate (CALCIUM 600 PO)    NONFORMULARY, OTHER, ORDER *I*    Vitamin K, Phytonadione, 100 MCG TABS    TURMERIC CURCUMIN PO    HYDROcodone-acetaminophen (NORCO) 7.5-325 MG per tablet    XELJANZ 5 MG tablet    cholecalciferol (VITAMIN D) 1000 UNIT tablet     No current facility-administered medications for this visit.      Allergy History as of 07/19/15      No Known Allergies (drug, envir, food or latex)                Foot Pain       Patient's medications, allergies, past medical, surgical, social and family histories were reviewed and updated as appropriate.          Objective:   Physical Exam    Patient appears well.  There are no apparent signs of distress.  The patient is awake and alert.      Vasc:    DP: 2/4, PT: 2/4 bilaterally.  Skin temperature, color & texture are within normal limits.   There are no varicosities, edema, or atrophic changes noted.   Positive digital hair growth is noted bilaterally.  Capillary filling time is less than 3 seconds bilaterally.     Derm:  Incision is well-healed    Ortho:  There is evidence of an Austin bunionectomy with toe in rectus position and reduction of the Hammertoe deformity.     Patient has significant swelling in the area of the PIPJ of the left second toe.  There is contracture as well.      Neuro:    Light touch & vibratory sensation are fully intact.   Deep tendon reflexes are normal bilaterally.  Proprioception is within normal limits bilaterally.        Assessment:      1. Bunion of right foot     2. Hammer toe of right foot     3. Pain in left foot     4. Hammer  toe of left foot  * Foot LEFT standard AP, Lateral, Obliqu    * Foot LEFT standard AP, Lateral, Obliqu             Plan:    Patient's right foot is doing very well at this point.  Patient appears to have an arthritic flare of the left second toe.  What she does have contracture of the toe the significant swelling is more consistent with an inflammatory condition.  She was offered and given injection today of lidocaine with 3 mg of dexamethasone phosphate and 10 mg of Depo-Medrol.  She will follow-up in 3 months.

## 2015-11-25 NOTE — Procedures (Signed)
Bone density appears within normal limits.  There is evidence of a moderate bunion deformity noted.  There is a hammertoe deformity with buckling of the PIPJ.  An inferior calcaneal heel spur is noted.

## 2016-02-26 ENCOUNTER — Encounter: Payer: Self-pay | Admitting: Podiatry

## 2016-02-26 ENCOUNTER — Ambulatory Visit: Payer: Self-pay | Admitting: Podiatry

## 2016-02-26 VITALS — Ht 67.0 in | Wt 150.0 lb

## 2016-02-26 DIAGNOSIS — M79672 Pain in left foot: Secondary | ICD-10-CM

## 2016-02-26 DIAGNOSIS — M2042 Other hammer toe(s) (acquired), left foot: Secondary | ICD-10-CM

## 2016-02-26 DIAGNOSIS — M069 Rheumatoid arthritis, unspecified: Secondary | ICD-10-CM

## 2016-02-26 MED ORDER — CELECOXIB 200 MG PO CAPS *I*
200.0000 mg | ORAL_CAPSULE | Freq: Two times a day (BID) | ORAL | 2 refills | Status: DC
Start: 2016-02-26 — End: 2017-07-21

## 2016-03-05 ENCOUNTER — Encounter: Payer: Self-pay | Admitting: Podiatry

## 2016-03-05 NOTE — Procedures (Signed)
Bone density appears within normal limits.  There is a moderate bunion deformity noted with increased IM angle and lateral deviation of hallux.  There is contracture of the second toe with buckling at the PIPJ.  A small inferior calcaneal heel spur is noted as well.

## 2016-03-05 NOTE — Progress Notes (Signed)
Subjective:  The patient is in today for postop visit of her right foot as well as swelling in her left foot.     Chief Complaint   Patient presents with    Foot Pain     follow up surgery, right foot        Patient ID: Terry Harrison is a 65 y.o. female.  There is no problem list on file for this patient.    Current Outpatient Prescriptions   Medication    celecoxib (CELEBREX) 200 MG capsule    Calcium Carbonate (CALCIUM 600 PO)    NONFORMULARY, OTHER, ORDER *I*    Vitamin K, Phytonadione, 100 MCG TABS    XELJANZ 5 MG tablet    cholecalciferol (VITAMIN D) 1000 UNIT tablet     No current facility-administered medications for this visit.      Allergy History as of 07/19/15      No Known Allergies (drug, envir, food or latex)                Foot Pain       Patient's medications, allergies, past medical, surgical, social and family histories were reviewed and updated as appropriate.          Objective:   Physical Exam    Patient appears well.  There are no apparent signs of distress.  The patient is awake and alert.      Vasc:    DP: 2/4, PT: 2/4 bilaterally.  Skin temperature, color & texture are within normal limits.   There are no varicosities, edema, or atrophic changes noted.   Positive digital hair growth is noted bilaterally.  Capillary filling time is less than 3 seconds bilaterally.     Derm:  Incision is well-healed    Ortho:  There is evidence of an Austin bunionectomy with toe in rectus position and reduction of the Hammertoe deformity.     Patient has significant swelling in the area of the PIPJ of the left second toe.  There is contracture as well. There is a moderate bunion deformity of the left foot noted as well.     Neuro:    Light touch & vibratory sensation are fully intact.   Deep tendon reflexes are normal bilaterally.  Proprioception is within normal limits bilaterally.        Assessment:      1. Pain in left foot  Foot LEFT standard AP, Lateral, Oblique    Foot LEFT standard AP, Lateral,  Oblique   2. Hammer toe of left foot     3. Rheumatoid arthritis, involving unspecified site, unspecified rheumatoid factor presence               Plan:    We discussed at some point we may consider correction of the bunion deformity of the left foot as well as the hammertoe.  Today she was offered a prescription for Celebrex for acute inflammation.

## 2017-07-17 ENCOUNTER — Encounter: Payer: Self-pay | Admitting: Gastroenterology

## 2017-07-21 ENCOUNTER — Ambulatory Visit
Admission: RE | Admit: 2017-07-21 | Discharge: 2017-07-21 | Disposition: A | Discharge status: Home / Self Care | Payer: Medicare (Managed Care) | Source: Ambulatory Visit | Attending: Cardiology | Admitting: Cardiology

## 2017-07-21 ENCOUNTER — Encounter: Payer: Self-pay | Admitting: Cardiology

## 2017-07-21 ENCOUNTER — Other Ambulatory Visit: Payer: Self-pay | Admitting: Cardiology

## 2017-07-21 ENCOUNTER — Telehealth: Payer: Self-pay

## 2017-07-21 ENCOUNTER — Ambulatory Visit: Payer: Medicare (Managed Care) | Admitting: Cardiology

## 2017-07-21 VITALS — BP 100/70 | HR 55 | Ht 67.0 in | Wt 146.0 lb

## 2017-07-21 DIAGNOSIS — R9431 Abnormal electrocardiogram [ECG] [EKG]: Secondary | ICD-10-CM | POA: Insufficient documentation

## 2017-07-21 DIAGNOSIS — I493 Ventricular premature depolarization: Secondary | ICD-10-CM

## 2017-07-21 DIAGNOSIS — M069 Rheumatoid arthritis, unspecified: Secondary | ICD-10-CM

## 2017-07-21 DIAGNOSIS — R079 Chest pain, unspecified: Secondary | ICD-10-CM

## 2017-07-21 LAB — EXERCISE STRESS ECHO COMPLETE
Aortic Arch Diameter: 2 cm
Aortic Diameter (mid tubular): 2.8 cm
Aortic Diameter (sinus of Valsalva): 2.8 cm
BMI: 22.9 kg/m2
BP Diastolic: 66 mmHg
BP Systolic: 130 mmHg
BSA: 1.77 m2
Deceleration Time - MV: 147 ms
E/A ratio: 1.07
Echo RV Stroke Work Index Estimate: 596.6 mmHg•mL/m2
Estimated workload: 8.1 METS
Heart Rate: 71 {beats}/min
Height: 67 in
IVC Diameter: 1.7 cm
LA Diameter BSA Index: 1.9 cm/m2
LA Diameter Height Index: 2 cm/m
LA Diameter: 3.4 cm
LA Systolic Vol BSA Index: 22.1 mL/m2
LA Systolic Vol Height Index: 23 mL/m
LA Systolic Volume: 39.1 mL
LV ASE Mass BSA Index: 62.1 gm/m2
LV ASE Mass Height 2.7 Index: 26.1 gm/m2.7
LV ASE Mass Height Index: 64.5 gm/m
LV ASE Mass: 109.8 gm
LV CO BSA Index: 1.93 L/min/m2
LV Cardiac Output: 3.41 L/min
LV Diastolic Volume Index: 48 mL/m2
LV Posterior Wall Thickness: 0.9 cm
LV SV - LVOT SV Diff: -8.36 mL
LV SV BSA Index: 27.1 mL/m2
LV SV Height Index: 28.2 mL/m
LV Septal Thickness: 0.8 cm
LV Stroke Volume: 48 mL
LV Systolic Volume Index: 20.9 mL/m2
LVED Diameter BSA Index: 2.4 cm/m2
LVED Diameter Height Index: 2.5 cm/m
LVED Diameter: 4.2 cm
LVED Volume BSA Index: 48 mL/m2
LVED Volume BSA Index: 48 ml/m2
LVED Volume Height Index: 49.9 mL/m
LVED Volume: 85 mL
LVEF (Volume): 56 %
LVES Diameter BSA Index: 1.3 cm/m2
LVES Diameter Height Index: 1.4 cm/m
LVES Diameter: 2.3 cm
LVES Volume BSA Index: 20.9 mL/m2
LVES Volume BSA Index: 21 ml/m2
LVES Volume Height Index: 21.7 mL/m
LVES Volume: 37 mL
LVOT Area (calculated): 3.05 cm2
LVOT Cardiac Index: 2.26 L/min/m2
LVOT Cardiac Output: 4 L/min
LVOT Diameter: 1.97 cm
LVOT PWD VTI: 18.5 cm
LVOT PWD Velocity (mean): 57.1 cm/s
LVOT PWD Velocity (peak): 93.1 cm/s
LVOT SV BSA Index: 31.84 mL/m2
LVOT SV Height Index: 33.1 mL/m
LVOT Stroke Rate (mean): 174 mL/s
LVOT Stroke Rate (peak): 283.6 mL/s
LVOT Stroke Volume: 56.36 cc
MPHR: 152 {beats}/min
MR Regurgitant Fraction (LV SV Mtd): -0.17
MR Regurgitant Volume (LV SV Mtd): -8.4 mL
MV Peak A Velocity: 72.5 cm/s
MV Peak E Velocity: 77.8 cm/s
Mitral Annular E/Ea Vel Ratio: 11.91
Mitral Annular Ea Velocity: 6.53 cm/s
O2 sat peak: 98 %
O2 sat rest: 98 %
Peak DBP: 60 mmHg
Peak Gradient - TR: 22 mmHg
Peak HR: 165 {beats}/min
Peak SBP: 136 mmHg
Peak Velocity - TR: 234.56 cm/s
Percent MPHR: 108.6 %
Pulmonary Vascular Resistance Estimate: 6.5 mmHg
RA Pressure Estimate: 5 mmHg
RA Volume BSA Index: 15.8 mL/m2
RA Volume Height Index: 16.5 mL/m
RA Volume: 28 mL
RPP: 22440 BPM x mmHG
RR Interval: 845.07 ms
RV Peak Systolic Pressure: 27 mmHg
RVED Diameter BSA Index: 1.9 cm/m2
RVED Diameter Height Index: 2 cm/m
RVED Diameter: 3.43 cm
Stress Peak Stage: 2.25
Stress duration (min): 6 min
Stress duration (sec): 45 s
Tricuspid Annular Systolic Plane Excursion (TAPSE): 2.3 cm
Weight (lbs): 146 [lb_av]
Weight: 2336 oz

## 2017-07-21 MED ORDER — NITROGLYCERIN 0.4 MG SL SUBL *I*
0.4000 mg | SUBLINGUAL_TABLET | SUBLINGUAL | 1 refills | Status: AC | PRN
Start: 2017-07-21 — End: ?

## 2017-07-21 MED ORDER — SULFUR HEXAFLUORIDE MICROSPH 60.7-25 MG (LUMASON) IV SUSR *I*
2.0000 mL | INTRAMUSCULAR | Status: AC | PRN
Start: 2017-07-21 — End: 2017-07-21
  Administered 2017-07-21: 2 mL via INTRAVENOUS

## 2017-07-21 MED ORDER — METOPROLOL SUCCINATE 25 MG PO TB24 *I*
25.0000 mg | ORAL_TABLET | Freq: Every day | ORAL | 5 refills | Status: DC
Start: 2017-07-21 — End: 2017-08-03

## 2017-07-21 MED ORDER — ASPIRIN 81 MG PO TABS *I*
81.0000 mg | ORAL_TABLET | Freq: Every day | ORAL | Status: AC
Start: 2017-07-21 — End: ?

## 2017-07-21 NOTE — Progress Notes (Signed)
Dear Dr. Herschell Dimes:     Thank you for referring Terry Harrison, a 67 y.o. Female in consultation for exertional chest pressure/discomfort.    HPI: [name] is a very pleasant 67 year old female with a history of remote breast cancer status post mastectomy, and rheumatoid arthritis.  She presents with recent symptoms of chest and back discomfort.  About a month or so ago, after eating a dessert, she developed the onset of back and shoulder discomfort, radiating to both arms.  She describes a hot and clammy feeling, and overall she had symptoms for about 30-45 minutes.  The symptoms resolved after having a bowel movement.      Last week, on Thursday, she had recurrent similar symptoms which included an upper chest pressure that occurred while walking.  Again the symptoms lasted about 30 minutes overall.  This walk was taken after eating dinner.  She had recurrent symptoms of a similar nature on Friday morning, which prompted her to seek evaluation in the emergency department.  She reports having a CT angiogram of the chest, which was unremarkable, and cardiac enzymes were negative, so she was discharged home.    She has not had recurrent chest discomfort symptoms over the weekend.  She has no prior cardiac history.  She denies orthopnea, PND or lower extremity edema.  She has noted palpitations over the past few days, which have been mild.  She does not have a history of diabetes, hypertension or hyperlipidemia.  She is a nonsmoker.    Past Medical History:   Diagnosis Date    Breast cancer 2000    Rheumatoid arthritis 07/2011     Past Surgical History:   Procedure Laterality Date    KNEE ARTHROSCOPY Left 2014    knee arthroscopy Right 2000    MASTECTOMY, MODIFIED RADICAL Left 2000    PR CORRECT BUNION,METATARSAL OSTEOTOMY Right 08/01/2015    Procedure: CHEVRON BUNIONECTOMY;  Surgeon: Jolee Ewing, DPM;  Location: [facility];  Service: Podiatry    PR REPAIR OF HAMMERTOE,ONE Right 08/01/2015    Procedure: TOE HAMMERTOE CORRECTION 2nd toe;  Surgeon: Jolee Ewing, DPM;  Location: [facility];  Service: Podiatry     No family history on file.  Social History     Social History    Marital status: Married     Spouse name: N/A    Number of children: N/A    Years of education: N/A     Social History Main Topics    Smoking status: Never Smoker    Smokeless tobacco: Never Used    Alcohol use None    Drug use: Unknown    Sexual activity: Not Asked     Other Topics Concern    None     Social History Narrative    None     History   Smoking Status    Never Smoker   Smokeless Tobacco    Never Used     History   Alcohol use Not on file     History   Drug use: Unknown     ROS: 10 point review of systems otherwise negative.    Allergies: Patient has no known allergies (drug, envir, food or latex).    Medications:  Patient's Medications   New Prescriptions    No medications on file   Previous Medications    ASCORBIC ACID (VITAMIN C) 100 MG TABLET    Take 100 mg by mouth daily    CALCIUM CARBONATE (CALCIUM 600 PO)  Take by mouth    CHOLECALCIFEROL (VITAMIN D) 1000 UNIT TABLET    Take 1,000 Units by mouth daily    MAGNESIUM OXIDE (MAG-OX) 400 (241.3 MG) MG TABLET    Take 400 mg by mouth daily    NONFORMULARY, OTHER, ORDER *I*    Strontium citrate 680 mg daily    VITAMIN K, PHYTONADIONE, 100 MCG TABS    Take by mouth    XELJANZ 5 MG TABLET    Take 5 mg by mouth daily      Modified Medications    No medications on file   Discontinued Medications    CELECOXIB (CELEBREX) 200 MG CAPSULE    Take 1 capsule (200 mg total) by mouth 2 times daily        Vital Signs:  Blood pressure 100/70, pulse 55, height 1.702 m (5\' 7" ), weight 66.2 kg (146 lb).  Body mass index is 22.87 kg/m.     CONSTITUTIONAL:  Pleasant well-appearing middle-aged white female, conversant, NAD.  NEURO:  Alert and oriented, in no acute distress.  Grossly intact.  HEENT:   Oropharynx clear, mucous membranes moist, anicteric sclerae  LYMPH:  Supple without lymphadenopathy.     CARDIOVASCULAR:   A comprehensive cardiovascular exam was performed with details as follows:  Regular rate and rhythm, normal S1, S2, no S3 or S4.  There are no murmurs appreciated.  PMI is normal in position and character.  2+ radial and dorsalis pedis pulses bilaterally with no lower extremity edema noted. JVP is approximately 5 cm above the right atrium.  There are no carotid bruits noted bilaterally.    RESP:  Clear to auscultation bilaterally without wheezes, rales or rhonchi.  GI:  Soft, nontender, normal active bowel sounds, nondistended.  No abdominal bruits noted.  MUSCULOSKELETAL:  No joint effusions or asymmetry.  SKIN:  No rashes or lesions noted.     Labs:  No results found for: CHOL, HDL, LDLC, TRIG, CHHDC    12 Lead ECG: Sinus rhythm 55 bpm.  Ventricular trigeminy, with PVCs of left bundle branch block morphology with inferior leads directed axis.    Reason for ECG:  exertional chest pressure/discomfort.    Cardiac testing: Exercise stress echocardiogram 07/21/17    1.  Normal aerobic capacity with no exercise induced chest pain.  2.  Normal resting LV systolic function with no significant valvular abnormalities.  3.  Borderline stress ECG changes at >100% of max predicted heart rate.  No exercise induced arrhythmias noted.  4.  No significant overall augmentation of LV systolic function with LAD distribution wall motion abnormalities (see text) with stress.  5.  Abnormal stress echocardiogram suggestive of inducible myocardial ischemia in an extensive LAD distribution at the cardiac work load achieved.      Assessment: Terry Harrison is a 67 y.o. Female with history of rheumatoid arthritis presenting with 3 recent episodes of chest discomfort.  Her last 2 episodes were associated with physical exertion.  She was evaluated at a local emergency department last Friday, with a benign workup.  She has not  had recurrent symptoms over the weekend, but the history is concerning for accelerating angina.  A stress echocardiogram completed today revealed evidence of inducible ischemia in an LAD territory based on echo criteria.    Plan:   1.  Exertional angina, abnormal stress echocardiogram- We discussed the typical symptoms of angina, and we reviewed that females can at times have atypical presentations.  There are certainly typical qualities to her recent  symptoms, and we reviewed that her stress test reveals exercise induced wall motion abnormalities concerning for an LAD lesion.  Her resting echo was normal, arguing against significant infarct in this territory.  I recommended coronary angiography, which we will arrange for her at her earliest convenience.  We discussed the procedural details including the possibility of percutaneous or surgical revascularization, and she agrees to proceed.    She just had blood work done at the emergency department in [place], which we will request.    From a medication perspective, I asked her to start daily aspirin as well as metoprolol succinate 25 mg daily.  I will request her most recent fasting lipid profile, but we reviewed that if she does have confirmed CAD, intensive statin therapy will be recommended.    I will see her follow-up tentatively in one month, following her angiogram.    Thank you for the opportunity of sharing in the care of this patient.  We look forward to working with you in the future.    Renold Don, MD  [facility] and [facility]

## 2017-07-21 NOTE — Telephone Encounter (Signed)
Per Dr. Francesca Oman request - obtain copy of blood tests from [facility] for upcoming left heart cath scheduled for 07/23/17.  Spoke to Bruno in medical records [phone] and she will fax labs over from emergency room visit on 07/17/17.  She asked that we obtain a release the next time we see patient and fax to [phone].

## 2017-07-22 ENCOUNTER — Telehealth: Payer: Self-pay

## 2017-07-22 LAB — CBC AND DIFFERENTIAL
Baso # K/uL: 0.5 10*3/uL
Eos # K/uL: 1 10*3/uL
Hematocrit: 39.5 %
Hemoglobin: 13 g/dL
Lymph # K/uL: 12.1 10*3/uL
MCH: 29.7
MCHC: 32.9
MCV: 90.3 fL
Mean Platelet Volume: 8.5 fL (ref 7.5–11.5)
Mono # K/uL: 7.4 10*3/uL
Neut # K/uL: 79.4 10*3/uL
Nucl RBC # K/uL: 0 10*3/uL
Platelets: 246 10*3/uL
RBC: 4.37 MIL/uL
RDW: 15.1 %
WBC: 9.3 10*3/uL

## 2017-07-22 NOTE — Telephone Encounter (Signed)
No prior authorization needed for cardiac catheterization - call ref# [id number]

## 2017-07-23 ENCOUNTER — Observation Stay
Admission: AD | Admit: 2017-07-23 | Discharge: 2017-07-24 | Disposition: A | Discharge status: Home / Self Care | Payer: Medicare (Managed Care) | Source: Ambulatory Visit | Attending: Cardiology | Admitting: Cardiology

## 2017-07-23 ENCOUNTER — Encounter
Admission: AD | Disposition: A | Discharge status: Home / Self Care | Payer: Self-pay | Source: Ambulatory Visit | Attending: Cardiology

## 2017-07-23 ENCOUNTER — Encounter: Payer: Self-pay | Admitting: Vascular Surgery

## 2017-07-23 ENCOUNTER — Other Ambulatory Visit: Payer: Self-pay | Admitting: Psychiatry

## 2017-07-23 DIAGNOSIS — I25110 Atherosclerotic heart disease of native coronary artery with unstable angina pectoris: Principal | ICD-10-CM | POA: Insufficient documentation

## 2017-07-23 DIAGNOSIS — I429 Cardiomyopathy, unspecified: Secondary | ICD-10-CM | POA: Insufficient documentation

## 2017-07-23 DIAGNOSIS — R9439 Abnormal result of other cardiovascular function study: Secondary | ICD-10-CM | POA: Insufficient documentation

## 2017-07-23 DIAGNOSIS — I2510 Atherosclerotic heart disease of native coronary artery without angina pectoris: Secondary | ICD-10-CM

## 2017-07-23 DIAGNOSIS — Z955 Presence of coronary angioplasty implant and graft: Secondary | ICD-10-CM

## 2017-07-23 HISTORY — PX: PR ENDOLUMINAL CORONARY IVUS OCT I&R INITIAL VESSEL: 92978

## 2017-07-23 HISTORY — PX: PR RT/LT HEART CATHETERS: 93526

## 2017-07-23 HISTORY — DX: Atherosclerotic heart disease of native coronary artery without angina pectoris: I25.10

## 2017-07-23 HISTORY — PX: CARDIAC CATHETERIZATION: SHX172

## 2017-07-23 LAB — ACT LR, POCT
ACT LR, POCT: 237 s — ABNORMAL HIGH (ref 89–169)
ACT LR, POCT: 329 s — ABNORMAL HIGH (ref 89–169)
ACT LR, POCT: 285 s — ABNORMAL HIGH (ref 89–169)

## 2017-07-23 SURGERY — CORONARY ANGIO WITH POSSIBLE PTCA

## 2017-07-23 MED ORDER — ATORVASTATIN CALCIUM 80 MG PO TABS *I*
80.0000 mg | ORAL_TABLET | Freq: Every day | ORAL | Status: DC
Start: 2017-07-23 — End: 2017-07-24
  Administered 2017-07-23 – 2017-07-24 (×2): 80 mg via ORAL
  Filled 2017-07-23 (×2): qty 1

## 2017-07-23 MED ORDER — ASPIRIN 81 MG PO CHEW *I*
CHEWABLE_TABLET | ORAL | Status: AC
Start: 2017-07-23 — End: 2017-07-23
  Filled 2017-07-23: qty 1

## 2017-07-23 MED ORDER — NITROGLYCERIN IN D5W 100 MCG/ML IV SOLN *I*
INTRAVENOUS | Status: AC
Start: 2017-07-23 — End: 2017-07-23
  Filled 2017-07-23: qty 250

## 2017-07-23 MED ORDER — ASPIRIN 81 MG PO CHEW *I*
81.0000 mg | CHEWABLE_TABLET | Freq: Every day | ORAL | Status: DC
Start: 2017-07-23 — End: 2017-07-23

## 2017-07-23 MED ORDER — ASPIRIN 81 MG PO CHEW *I*
81.0000 mg | CHEWABLE_TABLET | Freq: Every day | ORAL | Status: DC
Start: 2017-07-23 — End: 2017-07-23
  Administered 2017-07-23: 81 mg via ORAL
  Administered 2017-07-23: 243 mg via ORAL

## 2017-07-23 MED ORDER — ACETAMINOPHEN 325 MG PO TABS *I*
650.0000 mg | ORAL_TABLET | ORAL | Status: DC | PRN
Start: 2017-07-23 — End: 2017-07-24

## 2017-07-23 MED ORDER — MAGNESIUM-OXIDE 400 (241.3 MG) MG PO TABS *WRAPPED*
800.0000 mg | ORAL_TABLET | Freq: Every day | ORAL | Status: DC
Start: 2017-07-23 — End: 2017-07-24
  Administered 2017-07-23 – 2017-07-24 (×2): 800 mg via ORAL
  Filled 2017-07-23 (×2): qty 2

## 2017-07-23 MED ORDER — CLOPIDOGREL BISULFATE 300 MG PO TABS *I*
ORAL_TABLET | ORAL | Status: DC | PRN
Start: 2017-07-23 — End: 2017-07-23
  Administered 2017-07-23: 600 mg via ORAL

## 2017-07-23 MED ORDER — FENTANYL CITRATE 50 MCG/ML IJ SOLN *WRAPPED*
INTRAMUSCULAR | Status: AC
Start: 2017-07-23 — End: 2017-07-23
  Filled 2017-07-23: qty 2

## 2017-07-23 MED ORDER — FENTANYL CITRATE 50 MCG/ML IJ SOLN *WRAPPED*
INTRAMUSCULAR | Status: DC | PRN
Start: 2017-07-23 — End: 2017-07-23
  Administered 2017-07-23 (×3): 25 ug via INTRAVENOUS

## 2017-07-23 MED ORDER — NITROGLYCERIN 0.4 MG SL SUBL *I*
0.4000 mg | SUBLINGUAL_TABLET | SUBLINGUAL | Status: DC | PRN
Start: 2017-07-23 — End: 2017-07-24

## 2017-07-23 MED ORDER — ALUM & MAG HYDROXIDE-SIMETH 200-200-20 MG/5ML PO SUSP *I*
30.0000 mL | Freq: Four times a day (QID) | ORAL | Status: DC | PRN
Start: 2017-07-23 — End: 2017-07-24

## 2017-07-23 MED ORDER — LIDOCAINE HCL 1 % IJ SOLN *I*
INTRAMUSCULAR | Status: AC
Start: 2017-07-23 — End: 2017-07-23
  Filled 2017-07-23: qty 20

## 2017-07-23 MED ORDER — MIDAZOLAM HCL 1 MG/ML IJ SOLN *I* WRAPPED
INTRAMUSCULAR | Status: AC
Start: 2017-07-23 — End: 2017-07-23
  Filled 2017-07-23: qty 2

## 2017-07-23 MED ORDER — HYDROCODONE-ACETAMINOPHEN 5-325 MG PO TABS *I*
2.0000 | ORAL_TABLET | Freq: Four times a day (QID) | ORAL | Status: DC | PRN
Start: 2017-07-23 — End: 2017-07-24

## 2017-07-23 MED ORDER — CLOPIDOGREL BISULFATE 75 MG PO TABS *I*
75.0000 mg | ORAL_TABLET | Freq: Every day | ORAL | Status: DC
Start: 2017-07-23 — End: 2017-07-23

## 2017-07-23 MED ORDER — SODIUM CHLORIDE 0.9 % IV SOLN WRAPPED *I*
75.0000 mL/h | Status: DC
Start: 2017-07-23 — End: 2017-07-24
  Administered 2017-07-23: 75 mL/h via INTRAVENOUS

## 2017-07-23 MED ORDER — METOPROLOL SUCCINATE 25 MG PO TB24 *I*
ORAL_TABLET | ORAL | Status: AC
Start: 2017-07-23 — End: 2017-07-23
  Filled 2017-07-23: qty 1

## 2017-07-23 MED ORDER — ASPIRIN 81 MG PO CHEW *I*
81.0000 mg | CHEWABLE_TABLET | Freq: Every day | ORAL | Status: DC
Start: 2017-07-24 — End: 2017-07-24
  Administered 2017-07-24: 81 mg via ORAL

## 2017-07-23 MED ORDER — CLOPIDOGREL BISULFATE 75 MG PO TABS *I*
75.0000 mg | ORAL_TABLET | Freq: Every day | ORAL | Status: DC
Start: 2017-07-24 — End: 2017-07-24
  Administered 2017-07-24: 75 mg via ORAL

## 2017-07-23 MED ORDER — CLOPIDOGREL BISULFATE 300 MG PO TABS *I*
ORAL_TABLET | ORAL | Status: AC
Start: 2017-07-23 — End: 2017-07-23
  Filled 2017-07-23: qty 1

## 2017-07-23 MED ORDER — HEPARIN SODIUM (PORCINE) 1000 UNIT/ML IJ SOLN *WRAPPED*
Status: AC
Start: 2017-07-23 — End: 2017-07-23
  Filled 2017-07-23: qty 10

## 2017-07-23 MED ORDER — VITAMIN C 250 MG PO TABS *I*
250.0000 mg | ORAL_TABLET | Freq: Every day | ORAL | Status: DC
Start: 2017-07-23 — End: 2017-07-24
  Administered 2017-07-23 – 2017-07-24 (×2): 250 mg via ORAL
  Filled 2017-07-23 (×2): qty 1

## 2017-07-23 MED ORDER — NITROGLYCERIN IN D5W 100 MCG/ML IV SOLN *I*
INTRAVENOUS | Status: AC | PRN
Start: 2017-07-23 — End: 2017-07-23
  Administered 2017-07-23: 200 ug via INTRA_ARTERIAL

## 2017-07-23 MED ORDER — METOPROLOL SUCCINATE 25 MG PO TB24 *I*
25.0000 mg | ORAL_TABLET | Freq: Every day | ORAL | Status: DC
Start: 2017-07-23 — End: 2017-07-24
  Administered 2017-07-23 – 2017-07-24 (×2): 25 mg via ORAL

## 2017-07-23 MED ORDER — IOHEXOL 350 MG/ML (OMNIPAQUE) IV SOLN *I*
INTRAVENOUS | Status: DC | PRN
Start: 2017-07-23 — End: 2017-07-23
  Administered 2017-07-23: 161 mL via INTRACORONARY

## 2017-07-23 MED ORDER — MIDAZOLAM HCL 5 MG/5ML IJ SOLN *I*
INTRAMUSCULAR | Status: DC | PRN
Start: 2017-07-23 — End: 2017-07-23
  Administered 2017-07-23: 1 mg
  Administered 2017-07-23: 2 mg
  Administered 2017-07-23: 1 mg via INTRAVENOUS

## 2017-07-23 MED ORDER — HEPARIN SODIUM (PORCINE) 1000 UNIT/ML IJ SOLN *WRAPPED*
Status: DC | PRN
Start: 2017-07-23 — End: 2017-07-23
  Administered 2017-07-23: 1000 [IU] via INTRAVENOUS
  Administered 2017-07-23: 5000 [IU] via INTRAVENOUS

## 2017-07-23 MED ORDER — SODIUM CHLORIDE 0.9 % IV SOLN WRAPPED *I*
3.0000 mL/kg/h | Status: AC
Start: 2017-07-23 — End: 2017-07-23

## 2017-07-23 SURGICAL SUPPLY — 21 items
BAND TR STANDARD (Closure Device) ×1
CATH 6F SHERPA BAL EBU3.5 (Cardiac Catheter (Interventional)) ×2 IMPLANT
CATH BLN SPRINTER 15 2.5 OTW (Balloon) ×2 IMPLANT
CATH OPTI RADI 6F JACKY (Cardiac Catheter (Interventional)) ×2 IMPLANT
CATHETER DIAG 2.7FR L135CM 0.014IN HYDRPHLC OCT IMAG DOC CVR 1 SYR RX TIP RADPQ MRK CMPTBL (Cardiac Catheter (Interventional)) ×1 IMPLANT
DEVICE COMPR L24CM REG RAD TR BND (Closure Device) ×1 IMPLANT
DEVICE INFLATION MERIT (Supply) ×2 IMPLANT
GUIDEWIRE 35-145 WHOLEY (Guidewire) ×2 IMPLANT
GUIDEWIRE CHOICE .014-300 FLOP (Guidewire) ×2 IMPLANT
GUIDEWIRE TIGHT .035IN X 260CM 3 (Guidewire) ×2 IMPLANT
GUIDEWIRE TSCF-35-145-3 (Guidewire) ×2 IMPLANT
KIT ANGIO MFLD PT BT2000 W/ TRNSDUC (Supply) ×1 IMPLANT
KIT DRAGONFLY OPTIS (Cardiac Catheter (Interventional)) ×1
KIT HAND CONTROL 54IN TUBING (Supply) ×2 IMPLANT
KIT MANIFOLD (Supply) ×1
PACK CUSTOM CARDIAC CATH PACK (Pack) ×2 IMPLANT
SHEATH GLIDESHEATH 6FR (Sheath) ×2 IMPLANT
SHIELD RADPAD INTERVENTIONAL (Supply) ×2 IMPLANT
STENT RESOLUTE INTEGRITY OTW 2.5X12 ×2 IMPLANT
STENT RESOLUTE INTEGRITY OTW 3.00X15 ×2 IMPLANT
VALVE COPILOT CONTROL (Supply) ×2 IMPLANT

## 2017-07-23 NOTE — H&P (Signed)
Subjective:     Terry Harrison is a 67 y.o. female with PMH of RA and DLD who was admitted 07/23/2017 with complaints of chest pain. female was referred for [facility].    67 year old female with RA who presents with SSCP on exertion alleviated by rest with positive stress test with LAD WMA duke treadmill score of 1.     Currently CP free     Stress echo with LAD WMA with stress 6 minutes of exercise total.         PMH / [facility]     Past Medical History:   Diagnosis Date    Breast cancer 2000    Rheumatoid arthritis 07/2011       Past Surgical History:   Procedure Laterality Date    KNEE ARTHROSCOPY Left 2014    knee arthroscopy Right 2000    MASTECTOMY, MODIFIED RADICAL Left 2000    PR CORRECT BUNION,METATARSAL OSTEOTOMY Right 08/01/2015    Procedure: CHEVRON BUNIONECTOMY;  Surgeon: Jolee Ewing, DPM;  Location: [facility];  Service: Podiatry    PR REPAIR OF HAMMERTOE,ONE Right 08/01/2015    Procedure: TOE HAMMERTOE CORRECTION 2nd toe;  Surgeon: Jolee Ewing, DPM;  Location: [facility];  Service: Podiatry       Social history      reports that she has never smoked. She has never used smokeless tobacco. Her alcohol, drug, and sexual activity histories are not on file.    Allergies:   Allergies   Allergen Reactions    Plaquenil [Hydroxychloroquine] Hives       Medications     Current Outpatient Prescriptions on File Prior to Encounter   Medication Sig Dispense Refill    magnesium oxide (MAG-OX) 400 (241.3 MG) MG tablet Take 800 mg by mouth daily         ascorbic acid (VITAMIN C) 100 MG tablet Take 100 mg by mouth daily      metoprolol (TOPROL-XL) 25 MG 24 hr tablet Take 1 tablet (25 mg total) by mouth daily   Do not crush or chew. May be divided. 30 tablet 5    aspirin 81 MG tablet Take 1 tablet (81 mg total) by mouth daily      Calcium Carbonate (CALCIUM 600 PO) Take by mouth      NONFORMULARY, OTHER, ORDER *I* Strontium citrate 680 mg daily      Vitamin K, Phytonadione, 100 MCG TABS Take by mouth       XELJANZ 5 MG tablet Take 11 mg by mouth daily         cholecalciferol (VITAMIN D) 1000 UNIT tablet Take 1,000 Units by mouth daily      nitroglycerin (NITROSTAT) 0.4 MG SL tablet Place 1 tablet (0.4 mg total) under the tongue every 5 minutes as needed for Chest pain   May repeat 2 times then call 911 if pain persists. 100 tablet 1     No current facility-administered medications on file prior to encounter.        Physical exam / Lab data     Vitals:    07/23/17 0648 07/23/17 0649   BP: 119/81 134/83   BP Location: Left arm Right arm   Pulse: 72    Resp: 20    Temp: 36.8 C (98.2 F)    TempSrc: Temporal    SpO2: 98%    Weight: 66.7 kg (147 lb)    Height: 1.702 m (5' 7.01")  Body mass index is 23.02 kg/m.  General: Well developed female, NAD  HEENT: Anicteric, MMM, OP clear, no adenopathy  Pulmonary: CTAB, no wheezes, rales, or rhonchi.  Normal work of breathing  Cardiac: RRR, normal S1 and S2.  No S3 or S4.  No murmurs, rubs, or gallops.  No JVD  Abdomen: Soft, NT, ND, NABS  Extremities: Warm, well perfused.  No edema, no rashes. PPP  Neurologic: Grossly normal  Airway Visibility: posterior pharynx    Pulses:   L radial   R radial    L Femoral   R Femoral    L posterior tibial   R posterior tibial    L dorsalis pedis   R dorsalis pedis      Lab Review   No results for input(s): NA, K, CL, CO2, UN, CREAT, GFRC, GFRB, GLU, CA in the last 8760 hours.        Lab results: 07/17/17  1204   WBC 9.3   Hemoglobin 13.0   Hematocrit 39.50   RBC 4.37   Platelets 246       No results for input(s): INR in the last 8760 hours.    No results for input(s): CKTS, TROPU, TROP, MCKMB, CKMB in the last 168 hours.    No components found with this basename: RICKMBS         Exercise Stress Echo Complete 07/21/2017    Narrative Summary:    1.  Normal aerobic capacity with no exercise induced chest pain.  2.  Normal resting LV systolic function with no significant valvular   abnormalities.  3.  Borderline stress ECG changes at  >100% of max predicted heart rate.    No exercise induced arrhythmias noted.  4.  No significant overall augmentation of LV systolic function with LAD   distribution wall motion abnormalities (see text) with stress.  5.  Abnormal stress echocardiogram suggestive of inducible myocardial   ischemia in an extensive LAD distribution at the cardiac work load   achieved.    Electronically signed by Renold Don, MD                   CKD stage: (1=slight, GFR>90 / 2=mild / 3=moderate / 4=severe / 5=end-stage, FOY<77) Not Applicable    Anesthesiologist's Physical Status rating of the patient: Class III: Severe Systemic Disease    Plan for sedation: Moderate  I am evaluating the patient immediately prior to admission of sedation medication. The plan for sedation remains appropriate.      Assessment:   Terry Harrison is a 67 y.o. female with PMH 67 year old female with RA who presents with SSCP on exertion alleviated by rest with positive stress test with LAD WMA duke treadmill score of 1.  Indications for procedure: chest pain     Plan:   Cardiac cath to rule out ischemic CAD.  Possible angioplasty.  The procedure and risks described to patient including risk of CVA, MI, bleeding, emergency surgery, death,  .  Renal: Hydration with NS or Limit dye use      Author: Leonette Most, MD  as of: 07/23/2017 at: 7:25 AM

## 2017-07-23 NOTE — Progress Notes (Signed)
[facility] Team Cath Lab Post Procedure NP Progress Note  Cath Lab Procedure: S/P PCI: 2 DES to LAD on 07/23/2017 by Dr Marinell Blight and Dr Arlana Pouch      HPI:   Terry Harrison is a 67 y.o. female with PMH of RA on Xeljanz and breast cancer who presented with SSCP on exertion alleviated by rest.  Over the past week she has had "3 incidents of back pain radiating to bilateral chest with the first one at rest and the other 2 on exertion".  She considers herself fairly healthy eating well and exercising regularly.  She is referred for angiogram following positive stress test with LAD WMA duke treadmill score of 1. Found to have stenosis to LAD.        SUBJECTIVE:  Doing well without complaints of chest pain, dyspnea, palpitations or access site issues s/p PCI.    ROS: As per HPI, otherwise 12 point ROS reviewed and noncontributory    OBJECTIVE:    Vitals Blood pressure 110/69, pulse 71, temperature 36.8 C (98.2 F), temperature source Temporal, resp. rate 15, height 1.702 m (5' 7.01"), weight 66.7 kg (147 lb), SpO2 95 %.   Physical Exam  Neuro: alert and oriented x 3. Appropriate affect.  CVS exam: normal rate, regular rhythm, normal S1, S2, no murmurs or rubs  Tele: NSR, HR 60s   Lungs: chest is clear, no wheezing or rales.   Abdomen: soft, none tender, none distended, positive bowel sounds.  Extremities: + pedal pulses, NO pedal edema   Procedure Access Site Right Radial TR band in place.  Artery access site without hematoma, bruit, ecchymosis or bleeding. Pulse intact at access site.    Labs  Recent Results (from the past 24 hour(s))   ACT LR, POCT    Collection Time: 07/23/17  8:04 AM   Result Value Ref Range    ACT LR, POCT 237 (H) 89 - 169 sec   ACT LR, POCT    Collection Time: 07/23/17  8:14 AM   Result Value Ref Range    ACT LR, POCT 329 (H) 89 - 169 sec   ACT LR, POCT    Collection Time: 07/23/17  8:30 AM   Result Value Ref Range    ACT LR, POCT 285 (H) 89 - 169 sec     ASSESSMENT:  CAD:  S/P PCI of 2 DES (drug eluting stent)  to LAD (left anterior descending) artery on 07/23/2017 by Dr Marinell Blight and Dr Arlana Pouch.  Pt is resting comfortably and pain free at the post cath lab holding area without post procedure complications.  Family is at the bedside. Pt lives out of town. Given no available room at local lodging, will keep pt overnight per Dr Marinell Blight.          PLAN:  CAD s/p PCI on 07/23/2017:  -  Routine post PCI care  -  EKG post procedure  -  Telemetry monitoring  -  Pt/family education re: plan of care, medications, low fat/low salt diet, increase physical activity, start cardiovascular exercise and activity restrictions re: access site.    -  Already on most CAD meds so continue:   -  Aspirin 81mg  daily    -  Metoprolol Succinate (Toprol) 25 mg  daily   -  Start Atorvastatin 80mg  nightly.    -  Start Plavix 75mg  every day.  -  Nitroglycerin under the tongue as needed for chest pain  -  Cardiac Rehab Counselling  -  Will  follow-up with cardiologist Dr Meda Coffee in 1-2 weeks    HTN:  -  Continue with Toprol 25 mg daily      Rheumatoid Arthritis:  -  Continue Xeljanz 11 mg daily    -  Continue with Vit C 100 mg daily, Vit D daily, Magnesium Oxide 800 mg daily, Vit K 100 mg daily, Strontium citrate 680 mg daily,   -  Up to date with flu and PNA vaccines  -  Pt and family understand and agree with the plan of care.  -  Disposition: anticipate D/C home Friday 07/24/2017      PCI Quality Measures:  Is the patient prescribed aspirin at discharge: yes  Is the patient prescribed a P2Y12 inhibitor at discharge: Yes  Is the patient prescribed at statin at discharge: Yes   Has the patient been referred to cardiac rehab: Yes      Erenest Rasher, NP                        07/23/2017        9:44 AM

## 2017-07-23 NOTE — Progress Notes (Signed)
Descriptive Sentence/ Reason for admission (Big Picture)   Procedure: LHC  Intervention/Results:  2 stents placed to mid LAD   Access: right  wrist    Sedation Type and Amt: 4 Versed; 75 Fentanyl    Baseline Neuro Status: A&Ox3, moving all extremities, and speech clear.    Active Issues/ Relevant Events (Last 24 hrs)   In Lab Complications?: None  Concerns:None  Hemostasis Type and Time: TR band removed from wrist at 1100. Good pleth, no hematoma.  VSS.   Post Procedure Neuro Status:  A&Ox3, moving all extremities, and speech clear.    Additional Meds in Procedure: see MAR   To Do List:   OOB:  As tolerated     Anticipatory Guidance/DC Planning   Plan is to D/C on 07/24/17.       Report given to Jacqulyn Cane RN   No concerns noted by receiving RN at time of handoff.      Alicia Amel, RN  1839 pm

## 2017-07-23 NOTE — Progress Notes (Signed)
07/23/17 1400   [facility] Patient Class Review   Patient Class Review Observation   Patient Class effective 07/23/2017  Connye Burkitt RN  Utilization Management  Ext [phone]

## 2017-07-23 NOTE — Progress Notes (Signed)
Assumed care of pt. VSS and NSR on tele. R radial CDI. No c/o pain or SOB. Husband at bedside.

## 2017-07-24 ENCOUNTER — Other Ambulatory Visit: Payer: Self-pay | Admitting: Psychiatry

## 2017-07-24 DIAGNOSIS — I2510 Atherosclerotic heart disease of native coronary artery without angina pectoris: Secondary | ICD-10-CM

## 2017-07-24 LAB — BASIC METABOLIC PANEL
Anion Gap: 12 (ref 7–16)
CO2: 25 mmol/L (ref 20–28)
Calcium: 9.1 mg/dL (ref 8.6–10.2)
Chloride: 103 mmol/L (ref 96–108)
Creatinine: 0.78 mg/dL (ref 0.51–0.95)
GFR,Black: 90 *
GFR,Caucasian: 78 *
Glucose: 92 mg/dL (ref 60–99)
Lab: 15 mg/dL (ref 6–20)
Potassium: 4.4 mmol/L (ref 3.3–5.1)
Sodium: 140 mmol/L (ref 133–145)

## 2017-07-24 LAB — CBC
Hematocrit: 40 % (ref 34–45)
Hemoglobin: 13.2 g/dL (ref 11.2–15.7)
MCH: 30 pg/cell (ref 26–32)
MCHC: 33 g/dL (ref 32–36)
MCV: 90 fL (ref 79–95)
Platelets: 219 10*3/uL (ref 160–370)
RBC: 4.4 MIL/uL (ref 3.9–5.2)
RDW: 14.9 % — ABNORMAL HIGH (ref 11.7–14.4)
WBC: 8 10*3/uL (ref 4.0–10.0)

## 2017-07-24 MED ORDER — CLOPIDOGREL BISULFATE 75 MG PO TABS *I*
75.0000 mg | ORAL_TABLET | Freq: Every day | ORAL | 3 refills | Status: DC
Start: 2017-07-25 — End: 2017-08-03

## 2017-07-24 MED ORDER — ASPIRIN 81 MG PO CHEW *I*
CHEWABLE_TABLET | ORAL | Status: AC
Start: 2017-07-24 — End: 2017-07-24
  Filled 2017-07-24: qty 1

## 2017-07-24 MED ORDER — CLOPIDOGREL BISULFATE 75 MG PO TABS *I*
ORAL_TABLET | ORAL | Status: AC
Start: 2017-07-24 — End: 2017-07-24
  Filled 2017-07-24: qty 1

## 2017-07-24 MED ORDER — METOPROLOL SUCCINATE 25 MG PO TB24 *I*
ORAL_TABLET | ORAL | Status: AC
Start: 2017-07-24 — End: 2017-07-24
  Filled 2017-07-24: qty 1

## 2017-07-24 MED ORDER — ATORVASTATIN CALCIUM 80 MG PO TABS *I*
80.0000 mg | ORAL_TABLET | Freq: Every day | ORAL | 0 refills | Status: DC
Start: 2017-07-25 — End: 2017-08-03

## 2017-07-24 NOTE — Progress Notes (Signed)
[facility] Team Cath/EP NP Discharge Note  Procedure: S/P PCI: 2 DES to LAD on 07/23/2017 by Dr Marinell Blight and Dr Arlana Pouch      HPI:   Terry Harrison a 67 y.o.femalewith PMH of RA on Xeljanz and breast cancer who presented with SSCP on exertion alleviated by rest.  Over the past week she has had "3 incidents of back pain radiating to bilateral chest with the first one at rest and the other 2 on exertion".  She considers herself fairly healthy eating well and exercising regularly.  She is referred for angiogram following positive stress test with LAD WMA duke treadmill score of 1. Found to have stenosis to LAD.      SUBJECTIVE:  Terry Harrison is a 67 y.o. female is doing well without complaints of chest pain, dyspnea,or access site issues.  Verbalized episodes of "skipped beats" at times.    OBJECTIVE:    Vitals Blood pressure 103/66, pulse 72, temperature 36.6 C (97.9 F), temperature source Temporal, resp. rate 16, height 1.702 m (5' 7.01"), weight 66.7 kg (147 lb), SpO2 97 %.   Physical Exam  Neuro: alert and oriented x 3. Appropriate affect.  CVS exam: normal rate, regular rhythm, normal S1, S2, no murmurs, rubs    Tele: NSR, HR 70s, frequent PVCs, episodes of ventricular trigeminy  Lungs: chest is clear, no wheezing or rales.    Abdomen: soft, none tender, none distended, positive bowel sounds.  Extremities: + pedal pulses, left ankle +2, right ankle +1 pedal edema.      Procedure Access Site: Right Radial artery access site without hematoma, bruit, ecchymosis or bleeding. Pulse intact at access site.    Labs  Recent Results (from the past 24 hour(s))   ACT LR, POCT    Collection Time: 07/23/17  8:04 AM   Result Value Ref Range    ACT LR, POCT 237 (H) 89 - 169 sec   ACT LR, POCT    Collection Time: 07/23/17  8:14 AM   Result Value Ref Range    ACT LR, POCT 329 (H) 89 - 169 sec   ACT LR, POCT    Collection Time: 07/23/17  8:30 AM   Result Value Ref Range    ACT LR, POCT 285 (H) 89 - 169 sec   Basic metabolic panel     Collection Time: 07/24/17  5:40 AM   Result Value Ref Range    Glucose 92 60 - 99 mg/dL    Sodium 140 133 - 145 mmol/L    Potassium 4.4 3.3 - 5.1 mmol/L    Chloride 103 96 - 108 mmol/L    CO2 25 20 - 28 mmol/L    Anion Gap 12 7 - 16    UN 15 6 - 20 mg/dL    Creatinine 0.78 0.51 - 0.95 mg/dL    GFR,Caucasian 78 *    GFR,Black 90 *    Calcium 9.1 8.6 - 10.2 mg/dL   CBC    Collection Time: 07/24/17  5:40 AM   Result Value Ref Range    WBC 8.0 4.0 - 10.0 THOU/uL    RBC 4.4 3.9 - 5.2 MIL/uL    Hemoglobin 13.2 11.2 - 15.7 g/dL    Hematocrit 40 34 - 45 %    MCV 90 79 - 95 fL    MCH 30 26 - 32 pg/cell    MCHC 33 32 - 36 g/dL    RDW 14.9 (H) 11.7 - 14.4 %  Platelets 219 160 - 370 THOU/uL       ASSESSMENT:   CAD:  S/P PCI of 2 DES (drug eluting stent) to LAD (left anterior descending) artery on 07/23/2017 by Dr Marinell Blight and Dr Arlana Pouch.  Pt is resting comfortably and pain free at the post cath lab holding area without post procedure complications.  Family is at the bedside. Pt lives out of town. Given no available room at local lodging, pt was kept overnight.  Aside from PVCs on tele no other issues.  Ready to go home this morning.       PLAN:  CAD s/p PCI on 07/23/2017:  -  Routine post PCI care  -  EKG post procedure  -  Telemetry monitoring  -  Pt/family education re: plan of care, medications, low fat/low salt diet, increase physical activity, start cardiovascular exercise and activity restrictions re: access site.    -  Already on most CAD meds so continue:   -  Aspirin 81mg  daily    -  Metoprolol Succinate (Toprol) 25 mg  daily   -  Start Atorvastatin 80mg  nightly.    -  Start Plavix 75mg  every day.  -  Nitroglycerin under the tongue as needed for chest pain  -  Cardiac Rehab Counselling  -  Will follow-up with cardiologist Dr Meda Coffee in 2 weeks    HTN:  -  Continue with Toprol 25 mg daily      Rheumatoid Arthritis:  -  Continue Xeljanz 11 mg daily    -  Continue with Vit C 100 mg daily, Vit D daily, Magnesium Oxide  800 mg daily, Vit K 100 mg daily, Strontium citrate 680 mg daily,   -  Up to date with flu and PNA vaccines  -  Pt and family understand and agree with the plan of care.       clopidogrel  75 mg Oral Daily    magnesium oxide  800 mg Oral Daily    ascorbic acid  250 mg Oral Daily    metoprolol  25 mg Oral Daily    aspirin  81 mg Oral Daily    atorvastatin  80 mg Oral Daily       -  Clinically stable s/p PCI  -  Seen by Dr Vale Haven for Dr Marinell Blight  -  Ready for discharge today  -  Will follow-up with Clydie Braun in 1-2 weeks  -  Follow up with Dr Meda Coffee: earliest availability in May 2019    PCI Quality Measures:  Is the patient prescribed aspirin at discharge: yes  Is the patient prescribed a P2Y12 inhibitor at discharge: Yes  Is the patient prescribed at statin at discharge: Yes   Has the patient been referred to cardiac rehab: Yes    Erenest Rasher, NP                                            07/24/2017                              7:50 AM

## 2017-07-24 NOTE — Plan of Care (Signed)
CARDIAC REHABILITATION PROGRAM REFERRAL FORM    To:  Cardiac Rehabilitation Department:   Sabra Heck    From:  [facility] Phase I Cardiac Rehab; [phone] (voicemail)  ______________________________________________________________________    Patient name: Terry Harrison                          DOB: 02/22/1951             Patient Phone #: [phone]        DIAGNOSIS:    S/P STEMI _____    S/P VAD placement _____    S/P NSTEMI _____                                        S/P Valve repair/replace _____    S/P CABG _____    S/P  Heart transplant _____    S/P PCI with stent ___X__                              S/P Stable Angina _____    S/P PCI without stent _____  Other _____    STABLE CHF: _________  Criteria= LVEF ? 35% AND NYHA Class II-IV on Medical Therapy for a least 6 weeks AND with NO recent (<6 weeks) or planned (<24months) major cardiovascular hospitalizations or procedures.      Primary Cardiologist:___Dr. Renold Don                                            *Please place order for cardiac rehab.*    PCP Clydie Braun, MD    COMMENTS:          Date: 07/24/2017

## 2017-07-24 NOTE — Discharge Instructions (Signed)
[facility]  Cardiac Catheterization  Electrophysiology Labs  Patient Discharge Instructions    Procedure Date: 07/23/2017   Discharge Date:  07/24/2017  Attending Physician: Dr. Marinell Blight                                 Brief Summary of [facility] Hospital Course:  You were admitted after percutaneous coronary intervention with coronary angioplasty and 2 drug eluting stents to your LAD (left anterior descending) artery on 07/23/2017. You were monitored on telemetry and your medications were optimized.         Procedure:  Cardiac Catheterization with Coronary Angioplasty and stent placement.    Recommended diet: Low sodium, Low cholesterol  A healthy diet is important to help you stay well. Some health conditions require you to be on a special diet. For example, if you have heart failure you should monitor your fluid intake and limit the amount of sodium including table salt. This will help you avoid fluid retention which can cause shortness of breath or swelling of the feet and ankles. Reading food labels is helpful when you are on a special diet. Follow instructions from your doctor for any other special dietary requirement.    Recommended physical activity:   If your procedure was performed through your RADIAL artery (wrist):    *Do not lift, push, or pull anything (e.g. shoveling, mowing, raking, vacuuming, shoveling) more than 10 pounds for 5 days.  *No gripping with your hand for 24 hours.  *Try to avoid typing for 24 hours.    *Avoid strenuous activity or rigorous exercise until cleared by your cardiologist.    Gradually increase your activity level as you resume you regular daily activities.  It is normal to have some days where you feel more tired. If you are short of breath, you are going too fast and working too hard. If you feel tired during any activity, stop and rest.    If you were driving prior to your procedure do not drive for 3 days.  You may resume sexual activity when able to walk up a flight of  stairs without difficulty    Wound Care:  A shower is permitted 24 hours after your procedure  Remove the dressing/band-aid before showering and allow the water to run over the puncture site.  Clean the site daily, for the next 5 days, with soap and water. Gently pat dry.    Place a clean band-aid over the site after washing until puncture site is healed.   Do not submerge your site in water for 7 days.    Questions or concerns:  It is normal to have:  1.  A lump at the puncture site. The lump can range in size of a pea to the size of a walnut.  This lump will slowly go away over the next month.  2.  Bruising around the site. The bruise may go though many color changes. It may take several weeks to completely go away.  3.  Soreness, which will improve within a few days.     What is not normal:  Sudden bright red bleeding or new firmness at the puncture site. If this occurs, apply firm pressure to the site and call 911.    Medications:    Already on most CAD meds so continue:   -  Aspirin 81mg  daily    -  Metoprolol Succinate (Toprol) 25 mg  daily   -  Start Atorvastatin 80mg  nightly.    -  Start Plavix 75mg  every day.  -  Nitroglycerin under the tongue as needed for chest pain  You may take Acetaminophen (Tylenol) as needed for pain.     Please refer to your medication reconciliation form and take your medications exactly as prescribed. Please have your prescriptions filled right away.     *If you are taking Metformin, Glucovance, or Avandamet, please DO NOT take for 48 hours after your procedure.    *You have been prescribed Aspirin and Plavix because you had cardiac stent/stents placed. These medications are very important in keeping the stent open. Please do not stop these medications unless directed by a cardiologist!    *If you have been prescribed Nitroglycerin and have chest pain, place 1 nitroglycerin under your tongue. If after 5 minutes you are still having chest pain, you may take a second nitroglycerin.  If after 5 minutes you are still having chest pain, you may take a third nitroglycerin. If you are still having chest pain after taking 3 nitroglycerin tablets, call 911 and go to the emergency department.    Medications for erectile dysfunction (Viagra/Levitra/Cialis) and Nitroglycerin should not be taken within 48 hours of each other!    Daily Weight    For some conditions (such as heart failure) you will need to weigh yourself every day at the same time on the same scale, preferably after you empty your bladder.  If you have an increase of 3 pounds in 2 days or 5 pounds in 1 week you should contact your physician.  A daily written log of your weights is a good way to keep track.  You should follow your doctors recommendations on monitoring your weight.    Smoking  Smoking can increase your chances of developing chronic health problems or worsen conditions you already have. If you smoke you should quit. Smoking cessation information has been given to you for your review to help you quit.  Medications to help you quit are available. Ask your doctor if you would like to receive these medications.    Diabetes Management  Blood sugar management is important to prevent progression of coronary artery disease. You blood sugar's have been *** during this admission. We suggest you follow-up closely with your primary care doctor and/or your endocrinologist if you are having difficulty with managing your blood sugars.     Blood work   n/a    What to do if my condition changes?  Within  24 hours of hospital discharge  If you experience  chest pain, shortness of breath, redness or drainage from your incision site, gain 2-3 pounds of weight in 1 day or 5 pounds in 1 week, signs of infection (redness, swelling, drainage, warmth to the touch over the puncture site, severe bruising in groin area, or fever > 101 degrees F) or concerns specific to this hospitalization, call Dr. Marinell Blight or you can call the cardiac catheterization  fellow on call at [phone] after hours.    24 hours after hospital discharge  If you experience  chest pain, shortness of breath or other problems after 24 hours of hospital discharge call Clydie Braun, MD at [phone].    Identification Card: You will receive a card that provides important information about your stent.  Please keep this with you at all times.    Follow-up  You have a follow-up appointment with Dr. Meda Coffee on Aug 24, 2017 at 9  am.  Please call for an earlier visit (1-2 weeks post procedure) that works with your schedule given that you are from out of town  [address] [place], [place] [address]  [phone]      Additional Written Instructions Provided to Patient: Yes  If yes, specify: Stent Folder including coronary stent brochure, Cardiac Rehab Folder    For more information:  Please visit the [facility] Cardiac Catheterization and Electrophysiology Lab web site for more information about your procedure, medical condition and for more medical references.      Provider Signature: Erenest Rasher, NP                 Instructions reviewed with patient by: ______________________________________        Signature/Title   Date

## 2017-07-24 NOTE — Progress Notes (Signed)
Assumed care of pt at 0630 post receiving report from [facility].  Vitals stable, afebrile, telemetry shows NSR with frequent PVC's, no complaints of pain.  Right radial access site dressing removed, site soft, no bleeding or hematoma noted, band aid applied.  Site restrictions reviewed with pt and husband.  Discharge instructions reviewed with pt and husband.  Plan to d/c home today with husband.

## 2017-07-25 LAB — EKG 12-LEAD
P: 53 deg
PR: 108 ms
QRS: 73 deg
QRSD: 84 ms
QT: 396 ms
QTc: 379 ms
Rate: 55 {beats}/min
Severity: ABNORMAL
T: 81 deg

## 2017-07-27 ENCOUNTER — Encounter: Payer: Self-pay | Admitting: Cardiology

## 2017-07-28 ENCOUNTER — Telehealth: Payer: Self-pay

## 2017-07-28 NOTE — Telephone Encounter (Signed)
Patient of Dr. Meda Coffee.  Devin from [place] needs referral for patient (fax [phone]).

## 2017-07-29 LAB — EKG 12-LEAD
P: 47 deg
P: 49 deg
PR: 112 ms
PR: 124 ms
QRS: 75 deg
QRS: 69 deg
QRSD: 80 ms
QRSD: 86 ms
QT: 420 ms
QT: 396 ms
QTc: 444 ms
QTc: 431 ms
Rate: 67 {beats}/min
Rate: 71 {beats}/min
Severity: NORMAL
Severity: ABNORMAL
T: 79 deg
T: 75 deg

## 2017-07-30 NOTE — Telephone Encounter (Signed)
No answer at cardiac rehab.  Will place the referral once patient seen in our office next week.    Rosie Golson E Shakeila Pfarr, PA

## 2017-08-03 ENCOUNTER — Ambulatory Visit: Payer: Medicare (Managed Care) | Attending: Cardiology | Admitting: Cardiology

## 2017-08-03 ENCOUNTER — Encounter: Payer: Self-pay | Admitting: Cardiology

## 2017-08-03 ENCOUNTER — Encounter: Payer: Self-pay | Admitting: Gastroenterology

## 2017-08-03 ENCOUNTER — Other Ambulatory Visit: Payer: Self-pay | Admitting: Cardiology

## 2017-08-03 VITALS — BP 104/62 | HR 62 | Ht 67.0 in | Wt 146.0 lb

## 2017-08-03 DIAGNOSIS — I2510 Atherosclerotic heart disease of native coronary artery without angina pectoris: Secondary | ICD-10-CM

## 2017-08-03 DIAGNOSIS — Z955 Presence of coronary angioplasty implant and graft: Secondary | ICD-10-CM

## 2017-08-03 MED ORDER — ATORVASTATIN CALCIUM 80 MG PO TABS *I*
80.0000 mg | ORAL_TABLET | Freq: Every day | ORAL | 1 refills | Status: DC
Start: 2017-08-03 — End: 2017-10-05

## 2017-08-03 MED ORDER — CLOPIDOGREL BISULFATE 75 MG PO TABS *I*
75.0000 mg | ORAL_TABLET | Freq: Every day | ORAL | 3 refills | Status: DC
Start: 2017-08-03 — End: 2018-06-29

## 2017-08-03 MED ORDER — METOPROLOL SUCCINATE 25 MG PO TB24 *I*
25.0000 mg | ORAL_TABLET | Freq: Every day | ORAL | 3 refills | Status: DC
Start: 2017-08-03 — End: 2017-10-05

## 2017-08-03 NOTE — Progress Notes (Signed)
CARDIOLOGY FOLLOW-UP NOTE   Dear Clydie Braun, MD,    Outpatient Cardiologist: Dr. Meda Coffee    I had the pleasure of seeing your patient, Terry Harrison, at the [facility] Cardiology clinic on 08/03/2017.    HPI:  Grisela Mesch is a 67 y.o. female with a history of remote breast cancer status post mastectomy and rheumatoid arthritis who was initially evaluated in cardiology consultation by Dr. Meda Coffee on 07/21/2017 for chest pain.  At that time, he recommended further workup with a stress test in the setting of accelerating angina.  She was found to have LAD ischemia on stress echocardiogram was referred for angiogram.    On 07/23/17, Emery underwent a coronary angiogram which revealed a 95% stenosed mid LAD.  She underwent PCI, was discharged home on dual antiplatelet therapy with aspirin and Plavix along with high intensity statin.  Metoprolol succinate 25 mg daily was continued.     Jayelyn notes that she has not had recurrence of angina.  She denies shortness of breath.  She has been able to walk with her husband without symptoms.  She does note ongoing occasional palpitations, particularly in the evening when laying down and at first waking up, sitting quietly drinking tea.  She notes that she has been compliant with prescribed medications though she is feeling sleepy and is curious as to whether this may be a medication side effect.  She has noted mild bruising.      PAST MEDICAL HISTORY:  Past Medical History:   Diagnosis Date    Breast cancer 2000    CAD (coronary artery disease) 07/23/2017    Rheumatoid arthritis 07/2011       SOCIAL HISTORY:  Social History     Social History    Marital status: Married     Spouse name: N/A    Number of children: N/A    Years of education: N/A     Occupational History    Not on file.     Social History Main Topics    Smoking status: Never Smoker    Smokeless tobacco: Never Used    Alcohol use Not on file    Drug use: Unknown    Sexual activity: Not on file          Social History Narrative    No narrative on file       ROS   General: No significant weight change,+ fatigue, -fevers  Skin: No new lesions  Eyes: No vision change  ENT: No nasal symptoms, hearing loss, sore throat, recent URI  Cardiovascular: HPI  Pulmonary: HPI  GI: No melena, hematochezia, bloating, nausea, vomiting, or change in bowel habits  GU: No hematuria,  dysuria, or polyuria  Heme: No bleeding/ +bruisability   Endo: No DM, thyroid normal  Musculoskeletal: No arthralgias/myalgias  Neuro: No headaches, seizures, stroke symptoms            ALLERGIES:  Allergies   Allergen Reactions    Plaquenil [Hydroxychloroquine] Hives       CURRENT MEDICATIONS:  Current Outpatient Prescriptions   Medication Sig Dispense Refill    atorvastatin (LIPITOR) 80 MG tablet Take 1 tablet (80 mg total) by mouth daily 30 tablet 0    clopidogrel (PLAVIX) 75 MG tablet Take 1 tablet (75 mg total) by mouth daily 90 tablet 3    tofacitinib (XELJANZ XR) 11 MG extended release tablet Take 11 mg by mouth daily   Swallow whole; do not cut, crush, or chew  magnesium oxide (MAG-OX) 400 (241.3 MG) MG tablet Take 800 mg by mouth daily         ascorbic acid (VITAMIN C) 100 MG tablet Take 100 mg by mouth daily      metoprolol (TOPROL-XL) 25 MG 24 hr tablet Take 1 tablet (25 mg total) by mouth daily   Do not crush or chew. May be divided. 30 tablet 5    aspirin 81 MG tablet Take 1 tablet (81 mg total) by mouth daily      nitroglycerin (NITROSTAT) 0.4 MG SL tablet Place 1 tablet (0.4 mg total) under the tongue every 5 minutes as needed for Chest pain   May repeat 2 times then call 911 if pain persists. 100 tablet 1    Calcium Carbonate (CALCIUM 600 PO) Take by mouth      NONFORMULARY, OTHER, ORDER *I* Strontium citrate 680 mg daily      Vitamin K, Phytonadione, 100 MCG TABS Take by mouth      cholecalciferol (VITAMIN D) 1000 UNIT tablet Take 1,000 Units by mouth daily       No current facility-administered medications for this  visit.        VITALS: There were no vitals taken for this visit.  PHYSICAL EXAM:  CONSTITUTIONAL: Pleasant and interactive  NEURO: Alert and oriented in no acute distress  HENT: MMM, oropharynx clear, anicteric sclerae  LYMPH: Supple without lymphadenopathy  NECK VASCULAR: JVP is approximately 6 cm above the right atrium.  There are no carotid bruits appreciated bilaterally.  CARDIOVASCULAR: A comprehensive cardiovascular exam was performed with details as follows: Regular rhythm, normal S1-S2, no S3 or S4, occasional ectopy, there are no murmurs or rubs appreciated. PMI is normal in position in character.  2+ radial and dorsalis pedis pulses with trace LLE ankle edema (chronic).  PULM: Lungs are clear to auscultation bilaterally without wheezes, rales, or rhonchi.  GI: Soft, nontender.  MSK/EXTREMITIES: edema as noted above.  Right wrist cath site well-healed without hematoma.    Cardiac Catheterization    Result Date: 07/23/2017  One vessel coronary artery disease (LAD). Mildly reduced left ventricular function. Normal left ventricular end diastolic pressure. Successful percutaneous coronary intervention (drug eluting stenting) of the left anterior descending artery. The patient was monitored continuously 1:1 throughout the entire procedure by me while sedation was administered.     Exercise Stress Echo Complete    Result Date: 07/21/2017  Summary: 1.  Normal aerobic capacity with no exercise induced chest pain. 2.  Normal resting LV systolic function with no significant valvular abnormalities. 3.  Borderline stress ECG changes at >100% of max predicted heart rate.  No exercise induced arrhythmias noted. 4.  No significant overall augmentation of LV systolic function with LAD distribution wall motion abnormalities (see text) with stress. 5.  Abnormal stress echocardiogram suggestive of inducible myocardial ischemia in an extensive LAD distribution at the cardiac work load achieved. Electronically signed by Renold Don, MD      No results found for: CHOL, HDL, LDLC, TRIG, [facility]  Lab Results   Component Value Date    NA 140 07/24/2017    K 4.4 07/24/2017    CL 103 07/24/2017    CO2 25 07/24/2017    UN 15 07/24/2017    CREAT 0.78 07/24/2017    GLU 92 07/24/2017     No components found for: GFR  No results found for: ALK, ALT, AST    My ECG Interpretation: sinus rhythm at 62bpm, 2 PVCs  Date of Tracing: 08/03/2017  Reason for ECG: Palpitations    ASSESSMENT:  1. CAD (coronary artery disease)    2. S/P coronary artery stent placement      Mulki Roesler is a 67 yo F with a history of remote breast cancer and rheumatoid arthritis who was recently evaluated for chest pain consistent with accelerating angina.  Further workup with a stress echocardiogram revealed LAD ischemia (nml LVEF) and she went on to have an angiogram with placement of a drug-eluting stent to the LAD.  She is here for follow-up today.      RECOMMENDATIONS:    CAD s/p PCI LAD: Anginal symptoms have completely resolved.  She is able to do gentle exercise without any chest discomfort or shortness of breath.  At this time, will refer her to cardiac rehab at [facility] at her request.  -Continue Toprol-XL 25 mg daily, dual antiplatelet therapy with aspirin/Plavix along with high intensity statin therapy  - Recent echo revealed normal LVEF    Palpitations: Notes occasional palpitations which are consistent with recorded PVCs.  Would recommend continuation of beta blocker.    Recommend follow-up in 6 months, sooner if needed.    As always, please do not hesitate if questions or issues arise.  I hope this has been helpful to you and I look forward to continuing to work with you in the future.      Sincerely,    Homero Fellers, PA  [facility] and [facility] Cardiology    This note was dictated using Systems analyst. A reasonable effort was made to proofread this note but there may be minor transcription/typographical errors.

## 2017-08-03 NOTE — Addendum Note (Signed)
Addended by: Althia Forts on: 08/03/2017 01:57 PM     Modules accepted: Orders

## 2017-08-06 LAB — EKG 12-LEAD
P: 54 deg
PR: 112 ms
QRS: 75 deg
QRSD: 93 ms
QT: 419 ms
QTc: 426 ms
Rate: 62 {beats}/min
Severity: ABNORMAL
T: 74 deg

## 2017-08-24 ENCOUNTER — Ambulatory Visit: Payer: Medicare (Managed Care) | Admitting: Cardiology

## 2017-08-28 ENCOUNTER — Encounter: Payer: Self-pay | Admitting: Gastroenterology

## 2017-09-09 ENCOUNTER — Encounter: Payer: Self-pay | Admitting: Gastroenterology

## 2017-09-16 ENCOUNTER — Telehealth: Payer: Self-pay

## 2017-09-16 NOTE — Telephone Encounter (Signed)
Calling to speak with Terry Harrison

## 2017-09-16 NOTE — Telephone Encounter (Signed)
Hx of CAD s/p stent, nml EF. Patient will reduce Toprol to 12.5 mg daily and atorvastatin 40 mg for one week. She will touch base to discuss if these changes have been effective.

## 2017-09-16 NOTE — Telephone Encounter (Signed)
LMTCB

## 2017-09-16 NOTE — Telephone Encounter (Signed)
Pt would like to speak with provider about medication adjustments.

## 2017-09-16 NOTE — Telephone Encounter (Signed)
I spoke to Terry Harrison. She would like to know if her dosages on both Metoprolol and Atorvastatin can be reduced. States the Metoprolol is making her very tired. She had hoped this would have resolved by now, but it has not. She also reports BP's in the 80-90/50-60 ranges consistently at cardiac rehab. [name] has also noticed increase ankle and wrist pain and new knee pain since starting on Atorvastatin. She asks if both medication doses can be cut in half to see if this alleviates her symptoms.    Please advise.

## 2017-10-05 ENCOUNTER — Telehealth: Payer: Self-pay

## 2017-10-05 DIAGNOSIS — I2510 Atherosclerotic heart disease of native coronary artery without angina pectoris: Secondary | ICD-10-CM

## 2017-10-05 MED ORDER — METOPROLOL SUCCINATE 25 MG PO TB24 *I*
12.5000 mg | ORAL_TABLET | Freq: Every day | ORAL | 1 refills | Status: DC
Start: 2017-09-17 — End: 2018-10-19

## 2017-10-05 MED ORDER — ATORVASTATIN CALCIUM 20 MG PO TABS *I*
20.0000 mg | ORAL_TABLET | Freq: Every day | ORAL | 5 refills | Status: DC
Start: 2017-10-05 — End: 2017-10-29

## 2017-10-05 NOTE — Telephone Encounter (Signed)
Patient would like to speak with someone regarding her medicaitons

## 2017-10-05 NOTE — Telephone Encounter (Signed)
A message was left for Terry Harrison to return my call.

## 2017-10-05 NOTE — Telephone Encounter (Signed)
Terry Harrison returned my call. Below message relayed. Verbalized understanding

## 2017-10-05 NOTE — Telephone Encounter (Signed)
Last labs received from [facility] 08/2017 with LDL of 39 on atorvastatin 80mg     Earlier in June was dose reduced to atorvastatin  40mg  (assuming half tablets of 80mg   And metoprolol)    Nursing please call patient back    We can try atorvastatin 20mg  daily- attempt to maintain on the lowest dose statin if possible with known CAD. I have placed a new order for the dose at her pharmacy and also updated her metoprolol RX    I have placed labs orders for her to obtain in 8 weeks (hepatic, CK, lipid and leigh's order for CMP). I have left a message for [facility] so that we can fax the orders but we can also mail her an RX if needed (to obtain around the end of Aug)    I have also sent her a text request to link her [place] account for convenience

## 2017-10-05 NOTE — Telephone Encounter (Signed)
I spoke to [place]. Recent decreased dose of Atorvastatin and Metoprolol. Want to discontinue Atorvastatin. Currently taking 40 mg daily. Dose reduced 2 weeks ago. Has Rheumatoid arthritis and is experiencing increased joint pain.     Please advise.

## 2017-10-14 ENCOUNTER — Encounter: Payer: Self-pay | Admitting: Gastroenterology

## 2017-10-29 ENCOUNTER — Other Ambulatory Visit: Payer: Self-pay

## 2017-10-29 MED ORDER — ATORVASTATIN CALCIUM 20 MG PO TABS *I*
20.0000 mg | ORAL_TABLET | Freq: Every day | ORAL | 3 refills | Status: DC
Start: 2017-10-29 — End: 2018-10-18

## 2017-10-29 NOTE — Telephone Encounter (Signed)
Patient would like a 90 ds of her atorvastatin called into the [facility] in [place].Marland Kitchen

## 2017-10-29 NOTE — Telephone Encounter (Signed)
Last OFV 08/03/17 [facility]  Dr Meda Coffee pt        Lab results: 07/24/17  0540   Sodium 140   Potassium 4.4   Chloride 103   CO2 25   UN 15   Creatinine 0.78   GFR,Caucasian 78   GFR,Black 90   Glucose 92   Calcium 9.1     08/28/17 Labs from [place] in media tab      Requests 90 days from 10/05/17 Rx

## 2017-11-23 ENCOUNTER — Telehealth: Payer: Self-pay

## 2017-11-23 DIAGNOSIS — Z955 Presence of coronary angioplasty implant and graft: Secondary | ICD-10-CM

## 2017-11-23 DIAGNOSIS — T148XXA Other injury of unspecified body region, initial encounter: Secondary | ICD-10-CM

## 2017-11-23 DIAGNOSIS — M069 Rheumatoid arthritis, unspecified: Secondary | ICD-10-CM

## 2017-11-23 NOTE — Telephone Encounter (Signed)
Pt would like to discuss reduction of medications.

## 2017-11-23 NOTE — Telephone Encounter (Signed)
Ensure she is not using any other NSAIDS    Continue ASA and plavix with recent DES placement    Have her obtain the CBC I ordered. We may need to talk to whomever manages her tofacitinib for RA with her DAPT once we have the results back    Chinita Greenland, NP

## 2017-11-23 NOTE — Telephone Encounter (Signed)
Patient questioning dose of clopidogrel. Patient developing large bruises encompassing the width of her arm. She had a recent bump to her her leg and the size of the bruise is extremely large. Noted that patient is currently taking Vitamin K supplement for osteoporosis.

## 2017-11-25 ENCOUNTER — Encounter: Payer: Self-pay | Admitting: Gastroenterology

## 2017-11-25 NOTE — Telephone Encounter (Signed)
I spoke to Terry Harrison. Labs were drawn at [facility] today. Is not taking any other NSAID. Is complaining of huge bruises on her arms and legs. Would like to reduce dose of Clopidogrel due to bruising. Is also taking Vit K and Strontium for bone density.    Please advise.

## 2017-11-25 NOTE — Telephone Encounter (Signed)
A message was left with her husband for Terry Harrison to return my call. She is currently at the lab getting blood work done.

## 2017-11-25 NOTE — Telephone Encounter (Signed)
Nursing please call patient back and ensure we get a copy of the labs    The vitamin/ supplements she is taking dose not interfere    She cannot stop/hold/change/alter the dose of the Asprin 81mg  daily and/or clopidogrel 75mg  daily- she has a DES placed in April with risk of stent occlusion and subsequent myocardial injury     We will ensure that she is not grossly thrombocytopenic / pancytopenic

## 2017-11-25 NOTE — Telephone Encounter (Signed)
Lenia requested [facility] send [place] results. Reiterated to her that she needs to stay on both ASA and Plavix without changing the dose to preserve the patency of her stents. Advised we will assess lab results for other potential issues that may be causing her bruising once we receive them.

## 2017-11-30 ENCOUNTER — Telehealth: Payer: Self-pay

## 2017-11-30 NOTE — Telephone Encounter (Signed)
Message left on my VM looking for lab results from Friday. Nothing has been received from [place].    I called [facility] and requested results be faxed to our office.

## 2017-12-02 NOTE — Telephone Encounter (Signed)
Faxed labs received. To APP's for review.

## 2017-12-02 NOTE — Telephone Encounter (Signed)
I spoke to Terry Harrison. She currently has a bruise that covers her entire arm. She is not happy about remaining on these medications. Has FUA with Dr Meda Coffee in October.

## 2017-12-02 NOTE — Telephone Encounter (Signed)
A message was left for Terry Harrison to return my call.

## 2017-12-02 NOTE — Telephone Encounter (Signed)
Reviewed labs.  H/H stable, no thrombocytopenia.      Renal function WNL and cholesterol at goal.  Continue current therapy.    Keylon Labelle E Rynell Ciotti, PA

## 2017-12-10 ENCOUNTER — Telehealth: Payer: Self-pay

## 2017-12-10 NOTE — Telephone Encounter (Signed)
Should she need to take an Antibiotic before her general cleaning dental appt?   Unscheduled at this time.  Stent placement was April 2019

## 2017-12-10 NOTE — Telephone Encounter (Signed)
12/10/2017    According to 2007 ACC/AHA guidelines for prevention of infective endocarditis, Raigan Baria does not require dental prophylaxis.     Cardiac Conditions Associated With the Highest Risk  of Adverse Outcome From Endocarditis for Which Prophylaxis  With Dental Procedures Is Reasonable  -Prosthetic cardiac valve or prosthetic material used for cardiac valve repair  -Previous IE  -Congenital heart disease (CHD)*             Unrepaired cyanotic CHD, including palliative shunts and conduits             Completely repaired congenital heart defect with prosthetic material or             device, whether placed by surgery or by catheter intervention, during the             first 6 months after the procedure             Repaired CHD with residual defects at the site or adjacent to the site of a             prosthetic patch or prosthetic device (which inhibit endothelialization)  -Cardiac transplantation recipients who develop cardiac valvulopathy  Prophylaxis is reasonable because endothelialization of prosthetic material  occurs within 6 months after the procedure.    A Guideline From the American Heart Association Rheumatic Fever,  Endocarditis, and Kawasaki Disease Committee, Council on Cardiovascular.  Circulation. [id number]    Above information provided to [facility]

## 2018-01-05 ENCOUNTER — Other Ambulatory Visit: Payer: Self-pay | Admitting: Cardiology

## 2018-01-05 ENCOUNTER — Encounter: Payer: Self-pay | Admitting: Cardiology

## 2018-01-05 ENCOUNTER — Ambulatory Visit: Payer: Medicare (Managed Care) | Attending: Cardiology | Admitting: Cardiology

## 2018-01-05 VITALS — BP 102/60 | HR 68 | Ht 67.0 in | Wt 145.0 lb

## 2018-01-05 DIAGNOSIS — I2510 Atherosclerotic heart disease of native coronary artery without angina pectoris: Secondary | ICD-10-CM

## 2018-01-05 DIAGNOSIS — I493 Ventricular premature depolarization: Secondary | ICD-10-CM

## 2018-01-05 DIAGNOSIS — Z9861 Coronary angioplasty status: Secondary | ICD-10-CM

## 2018-01-05 NOTE — Progress Notes (Signed)
Dear Dr. Herschell Dimes:     Terry Harrison, a 67 y.o. Female presents for follow up of  Coronary artery disease with previous PCI to the LAD.    HPI: Terry Harrison reports that she is doing well.  Following her PCI to the LAD back in April, she completed cardiac rehab.  At this point she is enjoying regular exercise, walking 3 miles at least 4 days per week.  She denies recurrent chest discomfort or shortness of breath during physical exertion.  She has not had palpitations with any frequency.  She feels less lightheaded after decreasing her metoprolol succinate down to 12.5 mg daily.  She continues to have occasional muscle aches, and she recently has been changed over to a different medication for rheumatoid arthritis.  She denies orthopnea, PND or lower extremity edema.    Past Medical History:   Diagnosis Date    Breast cancer 2000    CAD (coronary artery disease) 07/23/2017    Rheumatoid arthritis 07/2011     Past Surgical History:   Procedure Laterality Date    CARDIAC CATHETERIZATION N/A 07/23/2017    Procedure: Coronary Angio with Possible PTCA;  Surgeon: Gilmore Laroche, MD;  Location: [place];  Service: Cardiovascular    CARDIAC CATHETERIZATION N/A 07/23/2017    Procedure: Stent DES - Coronary;  Surgeon: Gilmore Laroche, MD;  Location: [facility] CARDIAC CATH LABS;  Service: Cardiovascular    KNEE ARTHROSCOPY Left 2014    knee arthroscopy Right 2000    MASTECTOMY, MODIFIED RADICAL Left 2000    PR CORRECT BUNION,METATARSAL OSTEOTOMY Right 08/01/2015    Procedure: CHEVRON BUNIONECTOMY;  Surgeon: Jolee Ewing, DPM;  Location[facility];  Service: Podiatry    PR INTRAVASC US,HEART,1ST VESSEL N/A 07/23/2017    Procedure: Intravascular Ultrasound;  Surgeon: Gilmore Laroche, MD;  Location: [facility] CARDIAC CATH LABS;  Service: Cardiovascular    PR REPAIR OF HAMMERTOE,ONE Right 08/01/2015    Procedure:  TOE HAMMERTOE CORRECTION 2nd toe;  Surgeon: Jolee Ewing, DPM;  [facility];  Service: Podiatry    PR RT/LT HEART CATHETERS N/A 07/23/2017    Procedure: Percutaneous Coronary Intervention;  Surgeon: Gilmore Laroche, MD;  Location: [facility] CARDIAC CATH LABS;  Service: Cardiovascular       ROS: Easy bruisability on dual antiplatelet therapy.  No spontaneous bleeding.  Remainder of 10 point review of systems otherwise negative.    Allergies: Plaquenil [hydroxychloroquine]    Medications:  Patient's Medications   New Prescriptions    No medications on file   Previous Medications    ASCORBIC ACID (VITAMIN C) 100 MG TABLET    Take 100 mg by mouth daily    ASPIRIN 81 MG TABLET    Take 1 tablet (81 mg total) by mouth daily    ATORVASTATIN (LIPITOR) 20 MG TABLET    Take 1 tablet (20 mg total) by mouth daily    CALCIUM CARBONATE (CALCIUM 600 PO)    Take by mouth       CHOLECALCIFEROL (VITAMIN D) 1000 UNIT TABLET    Take 1,000 Units by mouth daily    CLOPIDOGREL (PLAVIX) 75 MG TABLET    Take 1 tablet (75 mg total) by mouth daily    MAGNESIUM OXIDE (MAG-OX) 400 (241.3 MG) MG TABLET    Take 800 mg by mouth daily       METOPROLOL (TOPROL-XL) 25 MG 24 HR TABLET    Take 0.5 tablets (12.5 mg total) by mouth daily   Do not crush  or chew. May be divided.    NITROGLYCERIN (NITROSTAT) 0.4 MG SL TABLET    Place 1 tablet (0.4 mg total) under the tongue every 5 minutes as needed for Chest pain   May repeat 2 times then call 911 if pain persists.    NONFORMULARY, OTHER, ORDER *I*    Strontium citrate 680 mg daily    SARILUMAB (KEVZARA) 200 MG/1.14ML SOAJ    Inject into the skin every 14 days    VITAMIN K, PHYTONADIONE, 100 MCG TABS    Take by mouth   Modified Medications    No medications on file   Discontinued Medications    TOFACITINIB (XELJANZ XR) 11 MG EXTENDED RELEASE TABLET    Take 11 mg by mouth daily   Swallow whole; do not cut, crush, or chew        Vital Signs:  Blood pressure 102/60, pulse 68, height 1.702 m (5\' 7" ),  weight 65.8 kg (145 lb).  Body mass index is 22.71 kg/m.     GENERAL: Well appearing pleasant white female. NAD. Color good.  NECK: Trachea midline, no stridor. Carotid upstrokes normal with no bruits. JVP normal.  RESPIRATORY: Normal bilateral breath sounds without wheezes, rales or rhonchi. No dullness to percussion.  COR: Normal precordium and PMI. Normal S1 and S2, regular with occasional ectopy. No murmur, rub, or gallops.  ABD: soft, nontender, nondistended, normoactive bowel sounds, no organomegaly.  VASCULAR: 2+ pulses at radial, dorsalis pedis, and posterior tibial pulses bilaterally. Capillary refill within 2 seconds. No signs of venous insufficiency.   EXTREMITIES: No cyanosis, clubbing, or edema.    Cholesterol Profile:  No results found for: CHOL, HDL, LDLC, TRIG, [facility]   Lab Results   Component Value Date    NA 140 07/24/2017    K 4.4 07/24/2017    CL 103 07/24/2017    CO2 25 07/24/2017    UN 15 07/24/2017    GLU 92 07/24/2017     12 Lead GNF:AOZHY rhythm 68 bpm.  Occasional PVCs (left bundle branch morphology).    Reason for ECG:  irregular heart beat and CAD.    Most recent cardiac testing: Left heart catheterization with PCI 07/23/17    One vessel coronary artery disease (LAD).  Mildly reduced left ventricular function.  Normal left ventricular end diastolic pressure.  Successful percutaneous coronary intervention (drug eluting stenting) of the left anterior descending artery.      Assessment: Terry Harrison is a 67 y.o. Female with history of rheumatoid arthritis and coronary artery disease diagnosed earlier this year in the setting of exertional anginal symptoms, followed by an abnormal stress echocardiogram.  Subsequent coronary angiography revealed a severe LAD lesion, treated with drug-eluting stenting.  Her angina has resolved, and she remains asymptomatic at this time.    Plan:   1.  Coronary artery disease with previous PCI to the LAD- We reviewed her medications and her recent lab work.  I  recommended that she continue dual antiplatelet therapy at least to complete a 12 month course following PCI.  She will continue atorvastatin 20 mg daily, and her LDL cholesterol checked over the summer was 48 mg/dL, within HDL of 60.  No additional cardiac testing is indicated at this time.  She remains on low-dose metoprolol.    2.  Frequent PVCs, minimally symptomatic- She continues to have frequent PVCs, which at this point are not bothering her, continuing on low-dose metoprolol.  I did not suggest any changes.    I will see  her in follow-up in about 6 months, sooner if needed.    Thank you for the opportunity of sharing in the care of this patient.  We look forward to working with you in the future.    Renold Don, MD  [facility] and [facility] Cardiology

## 2018-01-07 LAB — EKG 12-LEAD
P: 58 deg
PR: 102 ms
QRS: 78 deg
QRSD: 94 ms
QT: 416 ms
QTc: 443 ms
Rate: 68 {beats}/min
T: 75 deg

## 2018-01-18 ENCOUNTER — Ambulatory Visit: Payer: Medicare (Managed Care) | Admitting: Cardiology

## 2018-03-19 ENCOUNTER — Encounter: Payer: Self-pay | Admitting: Gastroenterology

## 2018-06-25 ENCOUNTER — Telehealth: Payer: Self-pay

## 2018-06-25 NOTE — Telephone Encounter (Signed)
Left a message to patient that Dr.Nelson will call  Her to do the tel health on 4/21//20 at 10 am .please do not come to the office we will call you for this appointment,any questions call us back.

## 2018-06-29 ENCOUNTER — Other Ambulatory Visit: Payer: Self-pay

## 2018-06-29 MED ORDER — CLOPIDOGREL BISULFATE 75 MG PO TABS *I*
75.0000 mg | ORAL_TABLET | Freq: Every day | ORAL | 3 refills | Status: DC
Start: 2018-06-29 — End: 2018-07-27

## 2018-06-29 NOTE — Telephone Encounter (Signed)
Last OFV 01/05/18 DR Meda Coffee  FUA scheduled 07/27/18        Lab results: 07/24/17  0540 07/17/17  1204   WBC 8.0 9.3   Hemoglobin 13.2 13.0   Hematocrit 40 39.50   RBC 4.4 4.37   Platelets 219 246

## 2018-06-29 NOTE — Telephone Encounter (Signed)
Pt "stuck" in [place] for the time being. Needs script for Clopidogrel to be sent to [facility] in [place] (added to chart)-Please send 90 day script

## 2018-07-27 ENCOUNTER — Ambulatory Visit: Payer: Medicare (Managed Care) | Attending: Cardiology | Admitting: Cardiology

## 2018-07-27 ENCOUNTER — Encounter: Payer: Self-pay | Admitting: Cardiology

## 2018-07-27 VITALS — BP 100/73

## 2018-07-27 DIAGNOSIS — R002 Palpitations: Secondary | ICD-10-CM

## 2018-07-27 DIAGNOSIS — Z9861 Coronary angioplasty status: Secondary | ICD-10-CM

## 2018-07-27 DIAGNOSIS — I2510 Atherosclerotic heart disease of native coronary artery without angina pectoris: Secondary | ICD-10-CM

## 2018-07-27 DIAGNOSIS — I493 Ventricular premature depolarization: Secondary | ICD-10-CM

## 2018-07-27 NOTE — Progress Notes (Signed)
[facility] Telephone Visit     This is an established patient visit.    Reason for visit: Follow-up and Coronary Artery Disease    HPI: Mrs. Bittinger seems to be doing well.  She and her husband have chosen to remain in [place] a bit longer given the ongoing pandemic, as they have been able to socially distance and isolate appropriately there.  She will be returning home later this week.  She has been quite active, enjoying regular exercise, with no chest pain or shortness of breath limiting exertion.  She does complain of excessive bruising on dual antiplatelet therapy.  She denies orthopnea, PND or lower extremity edema.  She describes occasional brief palpitations, in the setting of known PVCs, usually noticeable in the evening when at rest.    Patient's problem list, allergies, and medications were reviewed and updated as appropriate. Please see the EHR for full details.    Physical Exam:    This visit was performed during a pandemic event, thus the physical exam was not performed.    Assessment: 68 year old female with history of rheumatoid arthritis and coronary artery disease, with previous unstable angina, status post PCI to the LAD now approximately 1 year ago.  By history, she remains free of anginal symptoms.  Her recent blood pressure readings are normal.  She does have excessive bruisability on dual antiplatelet therapy.    Plan:  1.  Coronary artery disease with previous PCI to the LAD- Given her excessive bruising, I will have her discontinue clopidogrel at this time, continuing on low-dose daily aspirin.  She will continue low-dose metoprolol and atorvastatin.  No additional cardiac testing is indicated at this time, in the absence of new symptoms.    2.  Symptomatic PVCs- Her palpitations appear to be manageable, continuing on low-dose metoprolol.    I will see her in follow-up in 1 year, sooner if needed.    The plan was discussed with the patient and the patient/patient rep demonstrated understanding to  the provider's satisfaction.    Consent was previously obtained from the patient to complete this telephone consult; including the potential for financial liability.    11-20 minutes were spent on the phone with the patient, patient representatives, and/or other attendees.       Renold Don, MD  [facility] Cardiology

## 2018-09-22 ENCOUNTER — Encounter: Payer: Self-pay | Admitting: Gastroenterology

## 2018-10-17 ENCOUNTER — Other Ambulatory Visit: Payer: Self-pay | Admitting: Cardiology

## 2018-10-19 ENCOUNTER — Other Ambulatory Visit: Payer: Self-pay

## 2018-10-19 MED ORDER — METOPROLOL SUCCINATE 25 MG PO TB24 *I*
12.5000 mg | ORAL_TABLET | Freq: Every day | ORAL | 3 refills | Status: DC
Start: 2018-10-19 — End: 2019-10-11

## 2018-10-19 NOTE — Telephone Encounter (Signed)
Last telemedicine visit 07/27/2018.  F/U 1 year.

## 2019-06-15 ENCOUNTER — Telehealth: Payer: Self-pay

## 2019-06-15 NOTE — Telephone Encounter (Signed)
LMOM to schedule yearly FUV

## 2019-06-16 NOTE — Telephone Encounter (Signed)
2nd attempt

## 2019-06-17 NOTE — Telephone Encounter (Signed)
Letter sent.

## 2019-07-29 ENCOUNTER — Encounter: Payer: Self-pay | Admitting: Gastroenterology

## 2019-08-01 ENCOUNTER — Other Ambulatory Visit: Payer: Self-pay | Admitting: Cardiology

## 2019-08-01 ENCOUNTER — Encounter: Payer: Self-pay | Admitting: Cardiology

## 2019-08-01 ENCOUNTER — Ambulatory Visit: Payer: Medicare (Managed Care) | Admitting: Cardiology

## 2019-08-01 VITALS — BP 105/69 | HR 65 | Ht 66.5 in | Wt 157.0 lb

## 2019-08-01 DIAGNOSIS — I2510 Atherosclerotic heart disease of native coronary artery without angina pectoris: Secondary | ICD-10-CM

## 2019-08-01 DIAGNOSIS — I493 Ventricular premature depolarization: Secondary | ICD-10-CM

## 2019-08-01 DIAGNOSIS — Z9861 Coronary angioplasty status: Secondary | ICD-10-CM

## 2019-08-01 LAB — EKG 12-LEAD
P: 54 deg
PR: 107 ms
QRS: 72 deg
QRSD: 91 ms
QT: 427 ms
QTc: 434 ms
Rate: 62 {beats}/min
T: 75 deg

## 2019-08-01 NOTE — Progress Notes (Signed)
Dear Dr. Herschell Dimes:     Terry Harrison, a 69 y.o. Female presents for follow up of  Coronary artery disease.    HPI: Terry Harrison seems to be doing very well.  She denies chest pain or shortness of breath with activities.  She denies lightheadedness.  She describes palpitations occurring in the evening time, though they remain manageable on low-dose metoprolol.  She does have known symptomatic PVCs.  She denies orthopnea, PND or lower extremity edema.    Past Medical History:   Diagnosis Date    Breast cancer 2000    CAD (coronary artery disease) 07/23/2017    Rheumatoid arthritis 07/2011     Past Surgical History:   Procedure Laterality Date    CARDIAC CATHETERIZATION N/A 07/23/2017    Procedure: Coronary Angio with Possible PTCA;  Surgeon: Gilmore Laroche, MD;  Location: [place];  Service: Cardiovascular    CARDIAC CATHETERIZATION N/A 07/23/2017    Procedure: Stent DES - Coronary;  Surgeon: Gilmore Laroche, MD;  Location: [facility] CARDIAC CATH LABS;  Service: Cardiovascular    KNEE ARTHROSCOPY Left 2014    knee arthroscopy Right 2000    MASTECTOMY, MODIFIED RADICAL Left 2000    PR CORRECT BUNION,METATARSAL OSTEOTOMY Right 08/01/2015    Procedure: CHEVRON BUNIONECTOMY;  Surgeon: Jolee Ewing, DPM;  [facility];  Service: Podiatry    PR INTRAVASC US,HEART,1ST VESSEL N/A 07/23/2017    Procedure: Intravascular Ultrasound;  Surgeon: Gilmore Laroche, MD;  Location: [facility] CARDIAC CATH LABS;  Service: Cardiovascular    PR REPAIR OF HAMMERTOE,ONE Right 08/01/2015    Procedure: TOE HAMMERTOE CORRECTION 2nd toe;  Surgeon: Jolee Ewing, DPM;  [facility];  Service: Podiatry    PR RT/LT HEART CATHETERS N/A 07/23/2017    Procedure: Percutaneous Coronary Intervention;  Surgeon: Gilmore Laroche, MD;  Location: [facility] CARDIAC CATH LABS;  Service: Cardiovascular       ROS: 10 point review of  systems otherwise negative.    Allergies: Plaquenil [hydroxychloroquine]    Medications:  Patient's Medications   New Prescriptions    No medications on file   Previous Medications    ACTEMRA ACTPEN 162 MG/0.9ML SOAJ        ASCORBIC ACID (VITAMIN C) 100 MG TABLET    Take 100 mg by mouth daily    ASPIRIN 81 MG TABLET    Take 1 tablet (81 mg total) by mouth daily    ATORVASTATIN (LIPITOR) 20 MG TABLET    TAKE ONE TABLET BY MOUTH ONCE DAILY    CALCIUM CARBONATE (CALCIUM 600 PO)    Take by mouth       CHOLECALCIFEROL (VITAMIN D) 1000 UNIT TABLET    Take 1,000 Units by mouth daily    MAGNESIUM OXIDE (MAG-OX) 400 (241.3 MG) MG TABLET    Take 800 mg by mouth daily       METOPROLOL (TOPROL-XL) 25 MG 24 HR TABLET    Take 0.5 tablets (12.5 mg total) by mouth daily Do not crush or chew. May be divided.    NITROGLYCERIN (NITROSTAT) 0.4 MG SL TABLET    Place 1 tablet (0.4 mg total) under the tongue every 5 minutes as needed for Chest pain   May repeat 2 times then call 911 if pain persists.    NONFORMULARY, OTHER, ORDER *I*    Strontium citrate 680 mg daily    VITAMIN K, PHYTONADIONE, 100 MCG TABS    Take by mouth   Modified Medications    No  medications on file   Discontinued Medications    SARILUMAB (KEVZARA) 200 MG/1.14ML SOAJ    Inject into the skin every 14 days        Vital Signs:  Blood pressure 105/69, pulse 65, height 1.689 m (5' 6.5"), weight 71.2 kg (157 lb).  Body mass index is 24.96 kg/m.     CONSTITUTIONAL:  Pleasant well-appearing female, conversant, NAD.  NEURO:  Alert and oriented, in no acute distress.  Grossly intact.  HEENT:  Oropharynx clear, mucous membranes moist, anicteric sclerae  LYMPH:  Supple without lymphadenopathy.     CARDIOVASCULAR:   A comprehensive cardiovascular exam was performed with details as follows:  Regular rate and rhythm, normal S1, S2, no S3 or S4.  There are no murmurs appreciated.  PMI is normal in position and character.  2+ radial and dorsalis pedis pulses bilaterally with no  lower extremity edema noted. JVP is approximately 5 cm above the right atrium.  There are no carotid bruits noted bilaterally.    RESP:  Clear to auscultation bilaterally without wheezes, rales or rhonchi.  GI:  Soft, nontender, normal active bowel sounds, nondistended.  No abdominal bruits noted.  MUSCULOSKELETAL:  No joint effusions or asymmetry.  SKIN:  No rashes or lesions noted.     Cholesterol Profile:  No results found for: CHOL, HDL, LDLC, TRIG, [facility]   Lab Results   Component Value Date    NA 140 07/24/2017    K 4.4 07/24/2017    CL 103 07/24/2017    CO2 25 07/24/2017    UN 15 07/24/2017    GLU 92 07/24/2017     12 Lead ECG: Sinus rhythm 62 bpm.  Normal axis and intervals.    Reason for ECG:  CAD.      Assessment: Terry Harrison is a 69 y.o. Female with history of rheumatoid arthritis, symptomatic PVCs and coronary artery disease with previous PCI to the LAD (April 2019).  She continues to do very well, with no recurrent anginal symptoms.  Her ECG and cardiac exam are benign.    Plan:   1.  CAD with previous PCI to the LAD- I did not suggest any changes to her medication regimen today.  She continues to do extremely well.  I encouraged her to contact me with any questions or concerns.  No additional cardiac testing is indicated at this time, in the absence of new symptoms.    I will see her in follow-up in 1 year, sooner if needed.    Thank you for the opportunity of sharing in the care of this patient.  We look forward to working with you in the future.    Renold Don, MD  [facility] and [facility] Cardiology

## 2019-10-11 ENCOUNTER — Other Ambulatory Visit: Payer: Self-pay

## 2019-10-11 MED ORDER — METOPROLOL SUCCINATE 25 MG PO TB24 *I*
12.5000 mg | ORAL_TABLET | Freq: Every day | ORAL | 3 refills | Status: DC
Start: 2019-10-11 — End: 2020-01-09

## 2019-10-11 NOTE — Telephone Encounter (Signed)
Last OV 08/01/2019 w/Dr. Meda Coffee  Recommended FOV 1 year

## 2019-11-16 ENCOUNTER — Other Ambulatory Visit: Payer: Self-pay | Admitting: Cardiology

## 2019-11-16 NOTE — Telephone Encounter (Signed)
Last OV 08/01/19 Terry Harrison 1year

## 2020-01-09 ENCOUNTER — Other Ambulatory Visit: Payer: Self-pay | Admitting: Cardiology

## 2020-01-09 NOTE — Telephone Encounter (Signed)
Last OV 08/01/19  F/U in 1 year

## 2020-03-21 ENCOUNTER — Encounter: Payer: Self-pay | Admitting: Gastroenterology

## 2020-07-15 ENCOUNTER — Other Ambulatory Visit: Payer: Self-pay

## 2020-07-19 ENCOUNTER — Telehealth: Payer: Self-pay

## 2020-07-19 NOTE — Telephone Encounter (Signed)
Lm-Return in about 1 year (around 07/31/2020) for Follow-up dr Meda Coffee

## 2020-07-20 NOTE — Telephone Encounter (Signed)
Patient has been scheduled

## 2020-08-24 ENCOUNTER — Encounter: Payer: Self-pay | Admitting: Gastroenterology

## 2020-09-11 ENCOUNTER — Encounter: Payer: Self-pay | Admitting: Cardiology

## 2020-09-11 ENCOUNTER — Ambulatory Visit: Payer: Medicare (Managed Care) | Admitting: Cardiology

## 2020-09-11 VITALS — BP 118/76 | HR 65 | Ht 67.0 in | Wt 156.0 lb

## 2020-09-11 DIAGNOSIS — R002 Palpitations: Secondary | ICD-10-CM

## 2020-09-11 DIAGNOSIS — Z789 Other specified health status: Secondary | ICD-10-CM

## 2020-09-11 DIAGNOSIS — Z9861 Coronary angioplasty status: Secondary | ICD-10-CM

## 2020-09-11 DIAGNOSIS — I2510 Atherosclerotic heart disease of native coronary artery without angina pectoris: Secondary | ICD-10-CM

## 2020-09-11 LAB — EKG 12-LEAD
P: 49 deg
PR: 130 ms
QRS: 74 deg
QRSD: 86 ms
QT: 416 ms
QTc: 432 ms
Rate: 65 {beats}/min
T: 77 deg

## 2020-09-11 NOTE — Progress Notes (Signed)
Dear Dr. Herschell Dimes:     [name], a 70 y.o. Female presents for follow up of  Coronary artery disease with previous PCI.    HPI: Mrs. Terry Harrison seems to be doing very well.  She denies chest pain or shortness of breath with activities.  She denies orthopnea, PND or lower extremity edema.  She has occasional palpitations in the evening.  She feels that her arthritis symptoms have also been stable.    Past Medical History:   Diagnosis Date    Breast cancer 2000    CAD (coronary artery disease) 07/23/2017    Rheumatoid arthritis 07/2011     Past Surgical History:   Procedure Laterality Date    CARDIAC CATHETERIZATION N/A 07/23/2017    Procedure: Coronary Angio with Possible PTCA;  Surgeon: Gilmore Laroche, MD;  Location: [place];  Service: Cardiovascular    CARDIAC CATHETERIZATION N/A 07/23/2017    Procedure: Stent DES - Coronary;  Surgeon: Gilmore Laroche, MD;  Location: [facility] CARDIAC CATH LABS;  Service: Cardiovascular    KNEE ARTHROSCOPY Left 2014    knee arthroscopy Right 2000    MASTECTOMY, MODIFIED RADICAL Left 2000    PR CORRECT BUNION,METATARSAL OSTEOTOMY Right 08/01/2015    Procedure: CHEVRON BUNIONECTOMY;  Surgeon: Jolee Ewing, DPM;  [facility];  Service: Podiatry    PR INTRAVASC US,HEART,1ST VESSEL N/A 07/23/2017    Procedure: Intravascular Ultrasound;  Surgeon: Gilmore Laroche, MD;  Location: [facility] CARDIAC CATH LABS;  Service: Cardiovascular    PR REPAIR OF HAMMERTOE,ONE Right 08/01/2015    Procedure: TOE HAMMERTOE CORRECTION 2nd toe;  Surgeon: Jolee Ewing, DPM;  [facility];  Service: Podiatry    PR RT/LT HEART CATHETERS N/A 07/23/2017    Procedure: Percutaneous Coronary Intervention;  Surgeon: Gilmore Laroche, MD;  Location: [facility] CARDIAC CATH LABS;  Service: Cardiovascular       ROS: 10 point review of systems otherwise negative.    Allergies: Contac  cold+flu night [dm-doxylamine-acetaminophen] and Hydroxychloroquine    Medications:  Patient's Medications   New Prescriptions    No medications on file   Previous Medications    ACTEMRA ACTPEN 162 MG/0.9ML SOAJ        ASCORBIC ACID (VITAMIN C) 100 MG TABLET    Take 100 mg by mouth daily    ASCORBIC ACID (VITAMIN C) 500 MG TABLET        ASPIRIN 81 MG EC TABLET    Take by mouth    ASPIRIN 81 MG TABLET    Take 1 tablet (81 mg total) by mouth daily    ATORVASTATIN (LIPITOR) 20 MG TABLET    TAKE 1 TABLET BY MOUTH ONCE DAILY    ATORVASTATIN (LIPITOR) 20 MG TABLET    Take by mouth    CALCIUM CARBONATE (CALCIUM 600 PO)    Take by mouth       CALCIUM CITRATE-VITAMIN D (CALCIUM CITRATE-VITAMIN D) 315-250 MG-UNIT PER TABLET    Take by mouth    CHOLECALCIFEROL (VITAMIN D) 1000 UNIT TABLET    Take 1,000 Units by mouth daily    ERGOCALCIFEROL (DRISDOL) 50,000 UNIT CAPSULE    Take by mouth    MAGNESIUM OXIDE (MAG-OX) 400 (241.3 MG) MG TABLET    Take 800 mg by mouth daily       MAGNESIUM PO        MENAQUINONE-7 (VITAMIN K2) 100 MCG CAPS        METHYLPREDNISOLONE (MEDROL) 4 MG TABLET PACK  METOPROLOL SUCCINATE ER (TOPROL-XL) 25 MG 24 HR TABLET    TAKE ONE-HALF TABLET BY MOUTH EVERY DAY (DO NOT CRUSH OR CHEW. MAY BE DIVIDED)    METOPROLOL SUCCINATE ER (TOPROL-XL) 25 MG 24 HR TABLET    Take by mouth    NITROGLYCERIN (NITROSTAT) 0.4 MG SL TABLET    Place 1 tablet (0.4 mg total) under the tongue every 5 minutes as needed for Chest pain   May repeat 2 times then call 911 if pain persists.    NONFORMULARY, OTHER, ORDER *I*    Strontium citrate 680 mg daily    TOCILIZUMAB (ACTEMRA) 162 MG/0.9ML PREFILLED SYRINGE    Inject into the skin    UNABLE TO FIND    Take by mouth    VITAMIN K, PHYTONADIONE, 100 MCG TABS    Take by mouth   Modified Medications    No medications on file   Discontinued Medications    No medications on file        Vital Signs:  Blood pressure 118/76, pulse 65, height 1.702 m (5\' 7" ), weight 70.8 kg (156 lb).   Body mass index is 24.43 kg/m.     CONSTITUTIONAL:  Pleasant female, conversant, NAD.  NEURO:  Alert and oriented, in no acute distress.  Grossly intact.  HEENT:  Oropharynx clear, mucous membranes moist, anicteric sclerae  LYMPH:  Supple without lymphadenopathy.     CARDIOVASCULAR:   A comprehensive cardiovascular exam was performed with details as follows:  Regular rate and rhythm, normal S1, S2, no S3 or S4.  There are no murmurs appreciated.  PMI is normal in position and character.  2+ radial and dorsalis pedis pulses bilaterally with no lower extremity edema noted. JVP is approximately 5 cm above the right atrium.  There are no carotid bruits noted bilaterally.    RESP:  Clear to auscultation bilaterally without wheezes, rales or rhonchi.  GI:  Soft, nontender, normal active bowel sounds, nondistended.  No abdominal bruits noted.  MUSCULOSKELETAL:  No joint effusions or asymmetry.  SKIN:  No rashes or lesions noted.     Cholesterol Profile:  No results found for: CHOL, HDL, LDLC, TRIG, [facility]   Lab Results   Component Value Date    NA 140 07/24/2017    K 4.4 07/24/2017    CL 103 07/24/2017    CO2 25 07/24/2017    UN 15 07/24/2017    GLU 92 07/24/2017     12 Lead ECG: Normal sinus rhythm 65 bpm.  Normal axis and intervals.    Reason for ECG:  CAD.      Assessment: DebraForkey is a 70 y.o. Female with history of rheumatoid arthritis, symptomatic PVCs and coronary artery disease with previous PCI to the LAD (April 2019).  She continues to do well, with no symptoms of angina.  Her blood pressure is well controlled.  Her ECG is normal.    Plan:   1.  Coronary artery disease with previous PCI- I did not recommend any changes to her medication regimen at this point.  She remains on standard medical therapy, with good blood pressure control.  I did not recommend additional cardiac testing at this time, in the absence of new symptoms.    I will see her in follow-up in 1 year, sooner if needed.    Thank you for the  opportunity of sharing in the care of this patient.  We look forward to working with you in the future.    Thurmond Butts  Meda Coffee, [place] [facility] Cardiology

## 2020-09-20 ENCOUNTER — Encounter: Payer: Self-pay | Admitting: Rheumatology

## 2020-09-20 ENCOUNTER — Ambulatory Visit: Payer: Medicare (Managed Care) | Admitting: Rheumatology

## 2020-09-20 VITALS — BP 124/76 | HR 70 | Temp 97.0°F | Resp 16 | Ht 66.5 in | Wt 156.0 lb

## 2020-09-20 DIAGNOSIS — I2510 Atherosclerotic heart disease of native coronary artery without angina pectoris: Secondary | ICD-10-CM

## 2020-09-20 DIAGNOSIS — M810 Age-related osteoporosis without current pathological fracture: Secondary | ICD-10-CM

## 2020-09-20 DIAGNOSIS — M0579 Rheumatoid arthritis with rheumatoid factor of multiple sites without organ or systems involvement: Secondary | ICD-10-CM

## 2020-09-20 NOTE — Progress Notes (Signed)
Rheumatology Follow-Up Note     PRIMARY CARE PHYSICIAN: Clydie Braun, MD    Subjective     HPI:   Terry Harrison is a 70 y.o. year old female who is here for follow up of seropositive rheumatoid arthritis.  Terry Harrison comes in today doing very well.  She is currently on Actemra every other week injection.  To recollect at the onset of her rheumatoid arthritis she had a sustained response to [place].  This response to waned and she subsequently had minimal responses to trials of Remicade and Orencia.  Initially she was placed on Kevzara but due to insurance mandates converted to Actemra she continues on Actemra 162 mg subcu every other week.  There have been no interruptions in Actemra due to medical concerns, infectious complications, etc.  She did contact us in May regarding" flare" affecting the left knee this resolves solely with over-the-counter ibuprofen.  This did not mandate a Medrol Dosepak.  Her symptoms have improved.  Laboratory assessments drawn in and about this time were within normal limits with a sedimentation rate of 0.  She is denying any other notable joint discomfort.  Other sites of disease activity have historically been her left wrist and her left ankle.  The sites are doing well.  She continues on strontium for management of her osteopenia.  Her bone densities have shown a consistent trend towards marginal improvement.  She has not taken any bisphosphonate therapy previously.  Next bone density is scheduled for December 2023.  No falls or fracture events are described.  Recent evaluation by her cardiologist did not mandate any changes in medical therapy.  Her history of ischemic heart disease (status postangioplasty) remained stable with no new cardiac events described.    Current Meds:  Current Outpatient Medications   Medication Sig    Strontium Chloride CRYS By no specified route  680MG  TABLETS    Menaquinone-7 (VITAMIN K2) 100 MCG CAPS     methylPREDNISolone (MEDROL) 4 MG tablet pack      aspirin 81 mg EC tablet Take by mouth    tocilizumab (ACTEMRA) 162 MG/0.9ML prefilled syringe Inject into the skin    metoprolol succinate ER (TOPROL-XL) 25 MG 24 hr tablet TAKE ONE-HALF TABLET BY MOUTH EVERY DAY (DO NOT CRUSH OR CHEW. MAY BE DIVIDED)    atorvastatin (LIPITOR) 20 MG tablet TAKE 1 TABLET BY MOUTH ONCE DAILY    magnesium oxide (MAG-OX) 400 (241.3 MG) MG tablet Take 800 mg by mouth daily       ascorbic acid (VITAMIN C) 100 MG tablet Take 100 mg by mouth daily    Calcium Carbonate (CALCIUM 600 PO) Take by mouth       cholecalciferol (VITAMIN D) 1000 UNIT tablet Take 1,000 Units by mouth daily    MAGNESIUM PO  (Patient not taking: Reported on 09/20/2020)    UNABLE TO FIND Take by mouth (Patient not taking: Reported on 09/20/2020)    calcium citrate-vitamin D (CALCIUM CITRATE-VITAMIN D) 315-250 mg-unit per tablet Take by mouth (Patient not taking: Reported on 09/20/2020)    ergocalciferol (DRISDOL) 50,000 unit capsule Take by mouth    ascorbic acid (VITAMIN C) 500 MG tablet  (Patient not taking: Reported on 09/20/2020)    atorvastatin (LIPITOR) 20 mg tablet Take by mouth (Patient not taking: Reported on 09/20/2020)    metoprolol succinate ER (TOPROL-XL) 25 mg 24 hr tablet Take by mouth (Patient not taking: Reported on 09/20/2020)    ACTEMRA ACTPEN 162 MG/0.9ML SOAJ  (Patient  not taking: Reported on 09/20/2020)    aspirin 81 MG tablet Take 1 tablet (81 mg total) by mouth daily (Patient not taking: Reported on 09/20/2020)    nitroglycerin (NITROSTAT) 0.4 MG SL tablet Place 1 tablet (0.4 mg total) under the tongue every 5 minutes as needed for Chest pain   May repeat 2 times then call 911 if pain persists.    NONFORMULARY, OTHER, ORDER *I* Strontium citrate 680 mg daily (Patient not taking: Reported on 09/20/2020)    Vitamin K, Phytonadione, 100 MCG TABS Take by mouth (Patient not taking: Reported on 09/20/2020)        PAST MEDICAL HISTORY: Reviewed and updated as needed.   FAMILY HISTORY:  Reviewed and updated as needed.   SOCIAL HISTORY: Reviewed and updated as needed.     REVIEW of SYSTEMS:    Constitutional:    Musculoskeletal: Joint stiffness and morning stiffness. No joint pain and no joint swelling. lasting about 15-30 minutes.    Skin: No rash.    Eyes: No eye dryness.    ENT: No dry mouth.    Respiratory:    Cardiac: No chest pain.    Gastrointestinal:    Genitourinary:    Endocrine:    Allergic/Immunologic:    Hematologic:    Neurologic:    Psychiatric:          PHYSICAL EXAMINATION:  BP 124/76    Pulse 70    Temp 36.1 C (97 F)    Resp 16    Ht 1.689 m (5' 6.5")    Wt 70.8 kg (156 lb)    SpO2 98%    BMI 24.80 kg/m   Body mass index is 24.8 kg/m.  Pain    09/20/20 1024   PainSc:   0 - No pain     Physical Exam  Musculoskeletal exam.  Hands exhibit no overt synovitis.  No swelling seen about the MCP nor IP joints.  No gross rheumatoid stigmata visible.  Left wrist exhibits limited dorsiflexion with marginal swelling.  Right wrist exhibited limited range of motion but no synovitis nor swelling.  Elbows no flexion contractures or nodularity.  Shoulder passive range of motion normal without glenohumeral swelling or effusion.  Knees exhibit no detectable effusion synovitis or warmth.  Crepitation noted with passive maneuvers but full range of motion.  Left ankle exhibits slight reduction in subtalar and tibiotalar motion but no synovitis or swelling.  Right ankle is normal.  Feet are unremarkable with exception of hammertoe deformities and slight hallux valgus deformity.  No synovitis or swelling or tenderness seen.    PRIOR STUDIES:  Labs: - External labs reviewed via scanned documents or [place]     Radiology:   -No recent imaging results.       Assessment   IMPRESSION:    ICD-10-CM ICD-9-CM    1. Rheumatoid arthritis involving multiple sites with positive rheumatoid factor  M05.79 714.0 CBC      Comprehensive metabolic panel      Sedimentation rate, automated      TB AG t-cell  stimulation      Comprehensive metabolic panel      CBC      Sedimentation rate, automated      Cyclic citrullinated peptide      Rheumatoid factor,screen         RECOMMENDATIONS:    Rheumatoid arthritis.  Continue Actemra as monotherapy.  Doing well.  No specific joint difficulties offered no medical complications reported.  Osteopenia.  Continue strontium.  FRAX assessments have not warranted pharmacologic intervention.  Continue supplemental vitamin D and calcium no falls or fracture events described.  Coronary artery disease.  Stable per recent cardiology evaluation with no change in medical therapy or new cardiac events.  Laboratory assessments will consist of basic metabolic profile, sedimentation rate, QuantiFERON assay and prior to next evaluation rheumatoid serologies as well.

## 2020-10-05 ENCOUNTER — Other Ambulatory Visit: Payer: Self-pay | Admitting: Cardiology

## 2020-10-05 NOTE — Telephone Encounter (Signed)
Last OV 09/11/2020  Recommended FUV - 1 year    No results for input(s): NA, K, CL, CO2, UN, CREAT, GFRC, GFRB, GLU, CA, TP, ALB, ALT, AST, ALK, TB in the last 8760 hours.  No results for input(s): CHOL, HDL, LDLC, LDL, TRIG, NHDLC, [facility]DC in the last 8760 hours.

## 2020-10-15 ENCOUNTER — Other Ambulatory Visit: Payer: Self-pay

## 2020-10-15 MED ORDER — ATORVASTATIN CALCIUM 20 MG PO TABS *I*
20.0000 mg | ORAL_TABLET | Freq: Every day | ORAL | 3 refills | Status: DC
Start: 2020-10-15 — End: 2021-07-24

## 2020-10-15 MED ORDER — ATORVASTATIN CALCIUM 20 MG PO TABS *I*
20.0000 mg | ORAL_TABLET | Freq: Every day | ORAL | 3 refills | Status: DC
Start: 2020-10-15 — End: 2021-12-11

## 2020-10-15 NOTE — Telephone Encounter (Signed)
Last OV 09/11/2020  Recommended FUV - 1 year

## 2020-10-15 NOTE — Telephone Encounter (Signed)
Last OV

## 2020-11-19 ENCOUNTER — Telehealth: Payer: Self-pay

## 2020-11-19 NOTE — Telephone Encounter (Signed)
PT IS SCHEDULED FOR AN APPT. WITH YOU IN December. THEY HAVE DECIDED TO LEAVE FOR [place] THE END OF October AND RETURN THE END OF MARCH. DO YOU WANT TO SEE HER IN October AND AGAIN IN April?    PLEASE ADVISE.

## 2021-01-08 ENCOUNTER — Other Ambulatory Visit: Payer: Self-pay | Admitting: Cardiology

## 2021-01-08 NOTE — Telephone Encounter (Signed)
OV 09/11/20 FUV 1yr

## 2021-01-15 ENCOUNTER — Telehealth: Payer: Self-pay

## 2021-01-15 ENCOUNTER — Other Ambulatory Visit: Payer: Self-pay

## 2021-01-15 DIAGNOSIS — M069 Rheumatoid arthritis, unspecified: Secondary | ICD-10-CM

## 2021-01-15 MED ORDER — ACTEMRA 162 MG/0.9ML SC SOSY
PREFILLED_SYRINGE | SUBCUTANEOUS | 1 refills | Status: DC
Start: 2021-01-15 — End: 2021-02-25

## 2021-01-15 NOTE — Telephone Encounter (Signed)
Actemra  to [facility]  [phone] phone for [facility]

## 2021-01-23 ENCOUNTER — Encounter: Payer: Self-pay | Admitting: Gastroenterology

## 2021-02-12 ENCOUNTER — Other Ambulatory Visit: Payer: Self-pay

## 2021-02-25 ENCOUNTER — Other Ambulatory Visit: Payer: Self-pay

## 2021-02-25 DIAGNOSIS — M069 Rheumatoid arthritis, unspecified: Secondary | ICD-10-CM

## 2021-02-25 MED ORDER — ACTEMRA 162 MG/0.9ML SC SOSY
PREFILLED_SYRINGE | SUBCUTANEOUS | 1 refills | Status: DC
Start: 2021-02-25 — End: 2021-07-09

## 2021-03-25 ENCOUNTER — Ambulatory Visit: Payer: Medicare (Managed Care) | Admitting: Rheumatology

## 2021-05-01 ENCOUNTER — Other Ambulatory Visit: Payer: Self-pay

## 2021-05-01 DIAGNOSIS — Z92241 Personal history of systemic steroid therapy: Secondary | ICD-10-CM

## 2021-05-01 DIAGNOSIS — M069 Rheumatoid arthritis, unspecified: Secondary | ICD-10-CM

## 2021-05-01 DIAGNOSIS — G9332 Myalgic encephalomyelitis/chronic fatigue syndrome: Secondary | ICD-10-CM

## 2021-05-01 DIAGNOSIS — M810 Age-related osteoporosis without current pathological fracture: Secondary | ICD-10-CM

## 2021-05-10 ENCOUNTER — Encounter: Payer: Self-pay | Admitting: Gastroenterology

## 2021-07-09 ENCOUNTER — Other Ambulatory Visit: Payer: Self-pay

## 2021-07-09 DIAGNOSIS — M069 Rheumatoid arthritis, unspecified: Secondary | ICD-10-CM

## 2021-07-09 MED ORDER — ACTEMRA 162 MG/0.9ML SC SOSY
PREFILLED_SYRINGE | SUBCUTANEOUS | 1 refills | Status: DC
Start: 2021-07-09 — End: 2022-01-23

## 2021-07-24 ENCOUNTER — Ambulatory Visit: Payer: Medicare (Managed Care) | Admitting: Rheumatology

## 2021-07-24 ENCOUNTER — Other Ambulatory Visit: Payer: Self-pay

## 2021-07-24 ENCOUNTER — Encounter: Payer: Self-pay | Admitting: Rheumatology

## 2021-07-24 VITALS — BP 120/72 | HR 72 | Temp 97.0°F | Resp 16 | Wt 158.0 lb

## 2021-07-24 DIAGNOSIS — Z92241 Personal history of systemic steroid therapy: Secondary | ICD-10-CM

## 2021-07-24 DIAGNOSIS — I2510 Atherosclerotic heart disease of native coronary artery without angina pectoris: Secondary | ICD-10-CM

## 2021-07-24 DIAGNOSIS — G9332 Myalgic encephalomyelitis/chronic fatigue syndrome: Secondary | ICD-10-CM

## 2021-07-24 DIAGNOSIS — M0579 Rheumatoid arthritis with rheumatoid factor of multiple sites without organ or systems involvement: Secondary | ICD-10-CM

## 2021-07-24 DIAGNOSIS — M069 Rheumatoid arthritis, unspecified: Secondary | ICD-10-CM

## 2021-07-24 DIAGNOSIS — M810 Age-related osteoporosis without current pathological fracture: Secondary | ICD-10-CM

## 2021-07-24 NOTE — Progress Notes (Signed)
Rheumatology Follow-Up Note     PRIMARY CARE PHYSICIAN: Warden Fillers, MD    Subjective     HPI:   Terry Harrison is a 71 y.o. year old female who is here for follow up of seropositive rheumatoid arthritis.  Terry Harrison comes in today for routine evaluation.  She is just recently returned from [place].  She continues to do very well on a course of Actemra 162 mg every other week injection.  She has had no significant flares of arthritis.  Historically her sites of most concerning inflammatory disease have been left wrist and left ankle which have done very well throughout her wintering in [place].  She reports no limitations in life due to arthritis pain.  She reports no interruptions in Actemra due to infectious complications or medical concerns.  By all accounts she is doing very well.  Relative to her diagnosis of osteopenia.  She has been on strontium therapy and doing very well.  In actuality BMD's have reflected recent increases in bone mineralization since initiation of strontium.  She has never been exposed to bisphosphonates previously.  She continues on supplemental vitamin D and calcium.  From the cardiac standpoint she has a history of ischemic heart disease and is doing very well with no recent change in medical therapy.  She reports stable venous stasis changes about the lower extremities.  She really offers no other new medical concerns.  Recent laboratory assessments drawn in the ensuing last 6 months are within normal limits once again noted this her strongly positive rheumatoid factor and CCP with normal sedimentation rate and negative QuantiFERON assay.  Current Meds:  Current Outpatient Medications   Medication Sig   . Ascorbic Acid (VITAMIN C PO) Take 1,000 mg by mouth daily   . tocilizumab (ACTEMRA) 162 MG/0.9ML prefilled syringe inject subcutaneously 1 syringe every other week based on current weight (thru company)   . metoprolol succinate ER (TOPROL-XL) 25 mg 24 hr tablet TAKE ONE-HALF TABLET BY  MOUTH EVERY DAY   . atorvastatin (LIPITOR) 20 mg tablet Take 1 tablet (20 mg total) by mouth daily   . Strontium Chloride CRYS By no specified route  680MG  TABLETS   . calcium citrate-vitamin D (CALCIUM CITRATE-VITAMIN D) 315-250 mg-unit per tablet Take by mouth (Patient not taking: Reported on 09/20/2020)   . Menaquinone-7 (VITAMIN K2) 100 MCG CAPS    . methylPREDNISolone (MEDROL) 4 MG tablet pack    . aspirin 81 MG tablet Take 1 tablet (81 mg total) by mouth daily (Patient not taking: Reported on 09/20/2020)   . nitroglycerin (NITROSTAT) 0.4 MG SL tablet Place 1 tablet (0.4 mg total) under the tongue every 5 minutes as needed for Chest pain   May repeat 2 times then call 911 if pain persists.   . Calcium Carbonate (CALCIUM 600 PO) Take by mouth      . Vitamin K, Phytonadione, 100 MCG TABS Take by mouth (Patient not taking: Reported on 09/20/2020)   . cholecalciferol (VITAMIN D) 1000 UNIT tablet Take 1,000 Units by mouth daily        PAST MEDICAL HISTORY: Reviewed and updated as needed.   FAMILY HISTORY: Reviewed and updated as needed.   SOCIAL HISTORY: Reviewed and updated as needed.     REVIEW of SYSTEMS:  Rheumatology Review of Systems:    PHYSICAL EXAMINATION:  BP 120/72   Pulse 72   Temp 36.1 C (97 F)   Resp 16   Wt 71.7 kg (158 lb)  SpO2 97%   BMI 25.12 kg/m   Body mass index is 25.12 kg/m.  Pain    07/24/21 1021   PainSc:   0 - No pain     Physical Exam  Hands exhibit no evidence of rheumatoid stigmata or deformities.  There was no synovitis seen about the MCP or IP joints.  Wrist exhibit well-preserved dorsiflexion.  Elbows and shoulders are unremarkable with normal range of motion without swelling or effusion.  Knees exhibit no joint space tenderness, synovitis warmth or effusion.  Ankles exhibit well-preserved subtalar and tibiotalar motion without evidence of synovitis or effusion.  Trace amount of peripheral edema with modest venous stasis changes including superficial varicosities are noted.   No other rashes, ulcerations are appreciated.    PRIOR STUDIES:  Labs: - Recent labs reviewed -- please see ERecord results review section for values.     Radiology:   -Recent imaging reports reviewed -- please see ERecord imaging section for details.       Assessment   IMPRESSION:    ICD-10-CM ICD-9-CM    1. Rheumatoid arthritis involving multiple sites with positive rheumatoid factor  M05.79 714.0       2. Osteoporosis, unspecified osteoporosis type, unspecified pathological fracture presence  M81.0 733.00       3. Coronary artery disease, unspecified vessel or lesion type, unspecified whether angina present, unspecified whether native or transplanted heart  I25.10 414.00             RECOMMENDATIONS:    Rheumatoid arthritis.  Doing very well with Actemra as monotherapy.  No deviations intended.  Continue same.  Laboratory monitoring is pending for the month of July and preceding her next evaluation of 6 months.  QuantiFERON assay will be repeated in the month of October.  Osteopenia without need to treat.  She been doing very well with supplemental strontium's, calcium and vitamin D.  I intention to repeat bone density in December 2023 or upon her return from [place] in the spring 2024.  Coronary artery disease without change in recent medications, etc.  Stable.  RTC 6 months.

## 2021-08-12 ENCOUNTER — Encounter: Payer: Self-pay | Admitting: Cardiology

## 2021-08-12 ENCOUNTER — Ambulatory Visit: Payer: Medicare (Managed Care) | Admitting: Cardiology

## 2021-08-12 ENCOUNTER — Other Ambulatory Visit: Payer: Self-pay

## 2021-08-12 VITALS — BP 114/76 | HR 69 | Ht 66.5 in | Wt 157.0 lb

## 2021-08-12 DIAGNOSIS — Z9861 Coronary angioplasty status: Secondary | ICD-10-CM

## 2021-08-12 DIAGNOSIS — Z789 Other specified health status: Secondary | ICD-10-CM

## 2021-08-12 DIAGNOSIS — I2510 Atherosclerotic heart disease of native coronary artery without angina pectoris: Secondary | ICD-10-CM

## 2021-08-12 NOTE — Progress Notes (Signed)
Dear Dr. Warnell Forester:     Terry Harrison, a 71 y.o. Female presents for follow up of  Coronary artery disease with previous PCI.    HPI: Terry Harrison seems to be doing well overall.  She describes occasional palpitations, which are generally mild, and have been longstanding, predating her presentation with coronary artery disease.  She remains on low-dose metoprolol succinate.  She denies chest pain or shortness of breath with usual activities.  She denies orthopnea, PND or lower extremity edema.    Past Medical History:   Diagnosis Date   . Breast cancer 2000   . CAD (coronary artery disease) 07/23/2017   . Rheumatoid arthritis 07/2011     Past Surgical History:   Procedure Laterality Date   . CARDIAC CATHETERIZATION N/A 07/23/2017    Procedure: Coronary Angio with Possible PTCA;  Surgeon: Shon Millet, MD;  Location: [facility] CARDIAC CATH LABS;  Service: Cardiovascular   . CARDIAC CATHETERIZATION N/A 07/23/2017    Procedure: Stent DES - Coronary;  Surgeon: Shon Millet, MD;  Location: [facility] CARDIAC CATH LABS;  Service: Cardiovascular   . KNEE ARTHROSCOPY Left 2014   . knee arthroscopy Right 2000   . MASTECTOMY, MODIFIED RADICAL Left 2000   . PR CORRECTION HAMMERTOE Right 08/01/2015    Procedure: TOE HAMMERTOE CORRECTION 2nd toe;  Surgeon: Marin Shutter, DPM;  Location: [facility];  Service: Podiatry   . PR CORRJ HALLUX VALGUS W/SESMDC W/DIST METAR OSTEOT Right 08/01/2015    Procedure: CHEVRON BUNIONECTOMY;  Surgeon: Marin Shutter, DPM;  Location: [facility];  Service: Podiatry   . PR ENDOLUMINAL CORONARY IVUS OCT I&R INITIAL VESSEL N/A 07/23/2017    Procedure: Intravascular Ultrasound;  Surgeon: Shon Millet, MD;  Location[facility] CARDIAC CATH LABS;  Service: Cardiovascular   . PR RT/LT HEART CATHETERS N/A 07/23/2017    Procedure: Percutaneous Coronary Intervention;  Surgeon: Shon Millet, MD;  Location: [facility]  CARDIAC CATH LABS;  Service: Cardiovascular       ROS: 10 point review of systems otherwise negative.    Allergies: Contac cold+flu night [dm-doxylamine-acetaminophen] and Hydroxychloroquine    Medications:  Patient's Medications   New Prescriptions    No medications on file   Previous Medications    ASCORBIC ACID (VITAMIN C PO)    Take 1,000 mg by mouth daily    ASPIRIN 81 MG TABLET    Take 1 tablet (81 mg total) by mouth daily    ATORVASTATIN (LIPITOR) 20 MG TABLET    Take 1 tablet (20 mg total) by mouth daily    CALCIUM CARBONATE (CALCIUM 600 PO)    Take by mouth       CALCIUM CITRATE-VITAMIN D (CALCIUM CITRATE-VITAMIN D) 315-250 MG-UNIT PER TABLET    Take by mouth    CHOLECALCIFEROL (VITAMIN D) 1000 UNIT TABLET    Take 1 tablet (1,000 units total) by mouth daily    MENAQUINONE-7 (VITAMIN K2) 100 MCG CAPS        METHYLPREDNISOLONE (MEDROL) 4 MG TABLET PACK        NITROGLYCERIN (NITROSTAT) 0.4 MG SL TABLET    Place 1 tablet (0.4 mg total) under the tongue every 5 minutes as needed for Chest pain   May repeat 2 times then call 911 if pain persists.    STRONTIUM CHLORIDE CRYS    By no specified route  680MG  TABLETS    TOCILIZUMAB (ACTEMRA) 162 MG/0.9ML PREFILLED SYRINGE    inject subcutaneously 1 syringe every other week based on current weight (thru  company)    VITAMIN K, PHYTONADIONE, 100 MCG TABS    Take by mouth   Modified Medications    No medications on file   Discontinued Medications    METOPROLOL SUCCINATE ER (TOPROL-XL) 25 MG 24 HR TABLET    TAKE ONE-HALF TABLET BY MOUTH EVERY DAY        Vital Signs:  Blood pressure 114/76, pulse 69, height 1.689 m (5' 6.5"), weight 71.2 kg (157 lb).  Body mass index is 24.96 kg/m.     CONSTITUTIONAL:  Pleasant female, conversant, NAD.  NEURO:  Alert and oriented, in no acute distress.  Grossly intact.  HEENT:  Oropharynx clear, mucous membranes moist, anicteric sclerae  LYMPH:  Supple without lymphadenopathy.     CARDIOVASCULAR:   A comprehensive cardiovascular exam was  performed with details as follows:  Regular rate and rhythm, normal S1, S2, no S3 or S4.  There are no murmurs appreciated.  PMI is normal in position and character.  2+ radial and dorsalis pedis pulses bilaterally with no lower extremity edema noted. JVP is approximately 5 cm above the right atrium.  There are no carotid bruits noted bilaterally.    RESP:  Clear to auscultation bilaterally without wheezes, rales or rhonchi.  GI:  Soft, nontender, normal active bowel sounds, nondistended.  No abdominal bruits noted.  MUSCULOSKELETAL:  No joint effusions or asymmetry.  SKIN:  No rashes or lesions noted.     Cholesterol Profile:  No results found for: CHOL, HDL, LDLC, TRIG, [facility]   Lab Results   Component Value Date    NA 140 07/24/2017    K 4.4 07/24/2017    CL 103 07/24/2017    CO2 25 07/24/2017    UN 15 07/24/2017    GLU 92 07/24/2017     12 Lead ECG: Normal sinus rhythm 65 bpm.  Normal axis and intervals.    Reason for ECG:  CAD.      Assessment: Terry Harrison is a 71 y.o. Female with history of rheumatoid arthritis, symptomatic PVCs and coronary artery disease with previous PCI to the LAD (April 2019).  She continues to do very well overall, with no recurrent cardiac symptoms.    Plan:   1.  CAD with previous PCI- We reviewed her medications.  At this point, I think it is reasonable for her to discontinue low-dose metoprolol succinate, as she does not have hypertension or particularly bothersome arrhythmia symptoms at this point.  We did discuss that she could go back on the metoprolol if her palpitations became more bothersome.  Otherwise, in the absence of new symptoms I did not recommend additional cardiac testing.    I will see her in follow-up in 1 year, sooner if needed.    Thank you for the opportunity of sharing in the care of this patient.  We look forward to working with you in the future.    Honor Junes, MD  [facility]

## 2021-08-13 LAB — EKG 12-LEAD
P: 51 deg
PR: 131 ms
QRS: 66 deg
QRSD: 82 ms
QT: 416 ms
QTc: 447 ms
Rate: 69 {beats}/min
T: 66 deg

## 2021-10-30 ENCOUNTER — Other Ambulatory Visit: Payer: Self-pay | Admitting: Rheumatology

## 2021-10-30 ENCOUNTER — Encounter: Payer: Self-pay | Admitting: Gastroenterology

## 2021-10-30 LAB — COMPREHENSIVE METABOLIC PANEL
ALT: 27 U/L (ref 10–49)
AST: 24 U/L (ref 0–34)
Albumin: 3.6 g/dL (ref 3.4–5.0)
Alk Phos: 82 U/L (ref 46–116)
Anion Gap: 6.3 (ref 4–13)
Bilirubin,Total: 1.6 mg/dL — ABNORMAL HIGH (ref 0.2–1.2)
CO2: 30.7 mmol/L (ref 20.0–31.0)
Calcium: 9.2 mg/dL (ref 8.7–10.4)
Chloride: 106 mmol/L (ref 98–107)
Creatinine: 0.87 mg/dL (ref 0.55–1.02)
Glomerular Filtration Rate: 68 mL/min (ref 60–999)
Glucose: 89 mg/dL (ref 74–106)
Lab: 26 mg/dL — ABNORMAL HIGH (ref 9.0–23.0)
Potassium: 3.8 mmol/L (ref 3.5–5.1)
Sodium: 143 mmol/L (ref 136–145)
Total Protein: 5.9 g/dl (ref 5.7–8.2)

## 2021-10-30 LAB — CBC
Baso # K/uL: 0 10*3/uL (ref 0.0–0.2)
Basophil %: 0.6 % (ref 0–2)
Eos # K/uL: 0.1 10*3/uL (ref 0.0–0.6)
Eosinophil %: 2.5 % (ref 0–7)
Hematocrit: 42.6 % (ref 37.0–47.0)
Hemoglobin: 14.6 gm/dL (ref 12.0–15.0)
Lymph # K/uL: 1.2 10*3/uL (ref 1.0–3.5)
Lymphocyte %: 20.2 % (ref 19–48)
MCH: 32.1 pg (ref 26–34)
MCHC: 34.2 g/dL (ref 32–36)
MCV: 93.9 fL (ref 78.0–100.0)
Mean Platelet Volume: 9.3 fl (ref 7.3–9.3)
Mono # K/uL: 0.6 10*3/uL (ref 0.0–1.0)
Monocyte %: 9.7 % — ABNORMAL HIGH (ref 3–9)
Neut # K/uL: 3.8 10*3/uL (ref 1.5–6.6)
Platelets: 151 10*3/uL (ref 150–450)
RBC: 4.54 M/uL (ref 3.60–5.00)
RDW: 13.3 % (ref 11.5–14.0)
Seg Neut %: 67 % (ref 40–74)
WBC: 5.7 10*3/uL (ref 4.0–10.5)
nRBC: 0.3 % (ref 0.0–2.0)

## 2021-12-10 ENCOUNTER — Other Ambulatory Visit: Payer: Self-pay | Admitting: Cardiology

## 2021-12-11 NOTE — Telephone Encounter (Signed)
Last OV 08/12/21  F/U in 1 year      Lab results: 10/30/21  0934   Sodium 143   Potassium 3.8   Chloride 106   CO2 30.7   UN 26.0*   Creatinine 0.87   Glucose 89   Calcium 9.2   Total Protein 5.9   Albumin 3.6   AST 24   Alk Phos 82   Bilirubin,Total 1.6*     No current lipid panel

## 2021-12-16 ENCOUNTER — Telehealth: Payer: Self-pay

## 2021-12-16 NOTE — Telephone Encounter (Signed)
Name of caller : Yonna    Provider performing surgery/procedure including official name of the department or practice :  [facility]    Office phone # :  [phone]    Fax # to send letter :  [phone]    Name of surgery/procedure:  Teeth cleaning, general check up    Date of anticipated surgery/procedure:  9/28    Any specific cardiac requests if known  (cardiac records, updated cardiac evaluation, medications) :   Do they need to pre medicate before cleaning?

## 2021-12-16 NOTE — Telephone Encounter (Signed)
Letter sent.    Lynne Righi A Jaeveon [place], PA

## 2022-01-02 ENCOUNTER — Telehealth: Payer: Self-pay

## 2022-01-02 NOTE — Telephone Encounter (Signed)
Name of caller :   [place]    Provider performing surgery/procedure including official name of the department or practice :   Dr. Christell Constant          Office phone # :   [phone]    Fax # to send letter :  [phone]    Name of surgery/procedure:  routine dental exam, xrays    Date of anticipated surgery/procedure:  01/02/2022    Any specific cardiac requests if known  (cardiac records, updated cardiac evaluation, medications) :   does she need to pre med before dental cleaning

## 2022-01-15 ENCOUNTER — Encounter: Payer: Self-pay | Admitting: Gastroenterology

## 2022-01-22 NOTE — Progress Notes (Addendum)
Rheumatology Follow-Up Note     PRIMARY CARE PHYSICIAN: Warden Fillers, MD    HPI:   Terry Harrison is a 71 y.o. year old female who is here for follow up of seropositive (CCP and RF) rheumatoid arthritis  To review, Terry Harrison has followed with our office since 2012.  She initially presented here with issues of wrist pain, stiffness.  Moved to her knee.  Prior medications include methotrexate (no improvement), HCQ (rash), xeljans x 4 years then waning response.  Tried several biologic infusions remicade, orencia - no good response.  She has been on Actemra since Oct 2019.  She has done well with this recently with no significant flares of arthritis.  Historically her most concerning issues have been her left wrist and left ankle.  At last visit she reported no limitations in life due to arthritis pain.  In regards to her osteopenia, she has been taking strontium supplements and doing well-BMD's have reflected increased bone mineralization.  She takes calcium and vitamin D supplements.  Last bone density scan Dec 2020 - has one scheduled for April 2024.  She does have a history of ischemic heart disease, follows with cardiology - Dr. Honor Junes - [facility].  Stress test.  2 stents placed 2019.  Doing well.  Stable venous stasis changes in lower extremities.  She spends time in [place] every year November through April.  Today Terry Harrison reports that she is doing very well overall.  No limitations in life due to arthritis pain.  She reports no interruptions to her Actemra since last visit for any reason.  She continues to take strontium supplements for her osteopenia.  Bone density has increased since starting strontium supplements.  Her next bone density scan is scheduled for April 2024.  She continues to follow with Dr. Arvilla Meres of [place] for her ischemic heart disease and history of stents.         Current Meds:  tocilizumab (ACTEMRA) 162 MG/0.9ML prefilled syringe [[id number]]   aspirin 81 MG tablet  [[id number]]  methylPREDNISolone (MEDROL) 4 MG tablet pack [[id number]]       REVIEW of SYSTEMS:    Constitutional: Negative.   Musculoskeletal: No morning stiffness.    Skin: Negative.    Eyes: Negative.    ENT: Negative.    Respiratory: Negative.    Cardiac: Negative.    Gastrointestinal:    Genitourinary:    Endocrine:    Allergic/Immunologic:    Hematologic:    Neurologic:    Psychiatric:            Objective      PHYSICAL EXAMINATION:  BP 122/80 (BP Location: Right arm, Patient Position: Sitting)   Pulse 66   Temp 35.7 C (96.3 F) (Temporal)   Resp 14   Ht 1.689 m (5' 6.5")   Wt 71.4 kg (157 lb 6.4 oz)   SpO2 93%   BMI 25.02 kg/m   Body mass index is 25.02 kg/m.  Pain    01/23/22 0903   PainSc:   0 - No pain     Constitutional:    Well-developed and well-nourished.    Neck:    Normal range of motion.    Cardiovascular:    Normal rate and regular rhythm.    Pulmonary:    Effort is normal and breath sounds are normal.            Joint exam: Hands are benign.  MCP and IP joints are nontender, no effusion synovitis.  Wrists have good range of motion, no tenderness or synovitis.  Shoulders have good range of motion, nontender no effusion.  Hips have good seated range of motion, no pain.  Knees have good range of motion, no joint space tenderness synovitis warmth or effusion.  Ankles have good range of motion, nontender.  No MTP tenderness.  No peripheral edema.  No rashes.      PRIOR STUDIES:  Labs:   - Recent labs reviewed -- please see ERecord results review section for values.  - All labs, last 3 months    Orders Only on 10/30/2021   Component Date Value    WBC 10/30/2021 5.7     RBC 10/30/2021 4.54     Hemoglobin 10/30/2021 14.6     Hematocrit 10/30/2021 42.6     MCV 10/30/2021 93.9     MCH 10/30/2021 32.1     MCHC 10/30/2021 34.2     RDW 10/30/2021 13.3     Platelets 10/30/2021 151     Mean Platelet Volume 10/30/2021 9.3     Automated Differential 10/30/2021 AUTO DIFF     Neut # K/uL 10/30/2021 3.80      Lymph # K/uL 10/30/2021 1.20     Mono # K/uL 10/30/2021 0.60     Eos # K/uL 10/30/2021 0.10     Baso # K/uL 10/30/2021 0.00     Seg Neut % 10/30/2021 67.0     Lymphocyte % 10/30/2021 20.2     Monocyte % 10/30/2021 9.7 (H)     Eosinophil % 10/30/2021 2.5     Basophil % 10/30/2021 0.6     nRBC 10/30/2021 0.3     Glucose 10/30/2021 89     UN 10/30/2021 26.0 (H)     Creatinine 10/30/2021 0.87     Glomerular Filtration Ra* 10/30/2021 68.0     Sodium 10/30/2021 143     Potassium 10/30/2021 3.8     Chloride 10/30/2021 106     CO2 10/30/2021 30.7     Anion Gap 10/30/2021 6.3     Calcium 10/30/2021 9.2     Total Protein 10/30/2021 5.9     Albumin 10/30/2021 3.6     Bilirubin,Total 10/30/2021 1.6 (H)     AST 10/30/2021 24     ALT 10/30/2021 27     Alk Phos 10/30/2021 82         Radiology:     -No recent imaging results.      Assessment    I  MPRESSION:    ICD-10-CM ICD-9-CM    1. Rheumatoid arthritis with positive rheumatoid factor, involving unspecified site  M05.9 714.0 CBC      Comprehensive metabolic panel      TB AG t-cell stimulation      Sedimentation rate, automated      Rheumatoid factor,screen      Cyclic citrullinated peptide      2. Osteopenia, unspecified location  M85.80 733.90       3. Coronary artery disease, unspecified vessel or lesion type, unspecified whether angina present, unspecified whether native or transplanted heart  I25.10 414.00       4. Encounter for long-term (current) use of high-risk medication  Z79.899 V58.69 TB AG t-cell stimulation      5. Long-term use of immunosuppressant medication  Z79.60 V58.69 TB AG t-cell stimulation      6. Rheumatoid arthritis, involving unspecified site, unspecified whether rheumatoid factor present  M06.9 714.0 tocilizumab (ACTEMRA) 162 MG/0.9ML prefilled syringe  RECOMMENDATIONS:   {      Rheumatoid arthritis: Continues to do well with Actemra as monotherapy.  Monitoring labs ordered.  Last QuantiFERON gold was 01/15/22 -negative    Osteopenia  without need to treat: Continues to do well with strontium supplements.  Bone density increasing.  She does not want to be on Fosamax or Prolia.  Continue vitamin D and calcium.  DEXA scan is scheduled for April 2024 after she returns from [place].    Coronary artery disease: Doing well overall-stable.  Continue to follow-up with cardiology.

## 2022-01-23 ENCOUNTER — Encounter: Payer: Self-pay | Admitting: Rheumatology

## 2022-01-23 ENCOUNTER — Other Ambulatory Visit: Payer: Self-pay

## 2022-01-23 ENCOUNTER — Ambulatory Visit: Payer: Medicare (Managed Care) | Attending: Rheumatology | Admitting: Rheumatology

## 2022-01-23 VITALS — BP 122/80 | HR 66 | Temp 96.3°F | Resp 14 | Ht 66.5 in | Wt 157.4 lb

## 2022-01-23 DIAGNOSIS — M069 Rheumatoid arthritis, unspecified: Secondary | ICD-10-CM

## 2022-01-23 DIAGNOSIS — I2510 Atherosclerotic heart disease of native coronary artery without angina pectoris: Secondary | ICD-10-CM

## 2022-01-23 DIAGNOSIS — M8580 Other specified disorders of bone density and structure, unspecified site: Secondary | ICD-10-CM

## 2022-01-23 DIAGNOSIS — Z7960 Long term (current) use of unspecified immunomodulators and immunosuppressants: Secondary | ICD-10-CM

## 2022-01-23 DIAGNOSIS — Z79899 Other long term (current) drug therapy: Secondary | ICD-10-CM

## 2022-01-23 DIAGNOSIS — M059 Rheumatoid arthritis with rheumatoid factor, unspecified: Secondary | ICD-10-CM

## 2022-01-23 MED ORDER — ACTEMRA 162 MG/0.9ML SC SOSY
PREFILLED_SYRINGE | SUBCUTANEOUS | 1 refills | Status: DC
Start: 2022-01-23 — End: 2022-09-08

## 2022-05-16 ENCOUNTER — Encounter: Payer: Self-pay | Admitting: Gastroenterology

## 2022-07-16 ENCOUNTER — Other Ambulatory Visit: Payer: Self-pay | Admitting: Gastroenterology

## 2022-07-17 ENCOUNTER — Other Ambulatory Visit: Payer: Self-pay | Admitting: Rheumatology

## 2022-07-17 LAB — COMPREHENSIVE METABOLIC PANEL
ALT: 29 U/L (ref 10–49)
AST: 22 U/L (ref 0–34)
Albumin: 3.5 g/dL (ref 3.4–5.0)
Alk Phos: 82 U/L (ref 46–116)
Anion Gap: 5.1 (ref 4–13)
Bilirubin,Total: 1 mg/dL (ref 0.2–1.2)
CO2: 28.9 mmol/L (ref 20.0–31.0)
Calcium: 9.1 mg/dL (ref 8.7–10.4)
Chloride: 108 mmol/L — ABNORMAL HIGH (ref 98–107)
Creatinine: 0.78 mg/dL (ref 0.55–1.02)
Glomerular Filtration Rate: 76.9 mL/min (ref 60–999)
Glucose: 87 mg/dL (ref 74–106)
Lab: 21 mg/dL (ref 9.0–23.0)
Potassium: 4.1 mmol/L (ref 3.5–5.1)
Sodium: 142 mmol/L (ref 136–145)
Total Protein: 6.1 g/dl (ref 5.7–8.2)

## 2022-07-17 LAB — RHEUMATOID FACTOR,SCREEN: Rheumatoid Factor: NEGATIVE

## 2022-07-17 LAB — CBC
Baso # K/uL: 0 10*3/uL (ref 0.0–0.2)
Basophil %: 0.4 % (ref 0–2)
Eos # K/uL: 0.1 10*3/uL (ref 0.0–0.6)
Eosinophil %: 3.2 % (ref 0–7)
Hematocrit: 43.3 % (ref 37.0–47.0)
Hemoglobin: 14.8 gm/dL (ref 12.0–15.0)
Lymph # K/uL: 1.1 10*3/uL (ref 1.0–3.5)
Lymphocyte %: 23 % (ref 19–48)
MCH: 32.1 pg (ref 26–34)
MCHC: 34.3 g/dL (ref 32–36)
MCV: 93.6 fL (ref 78.0–100.0)
Mean Platelet Volume: 9.1 fl (ref 7.3–9.3)
Mono # K/uL: 0.5 10*3/uL (ref 0.0–1.0)
Monocyte %: 10.8 % — ABNORMAL HIGH (ref 3–9)
Neut # K/uL: 3 10*3/uL (ref 1.5–6.6)
Platelets: 152 10*3/uL (ref 150–450)
RBC: 4.63 M/uL (ref 3.60–5.00)
RDW: 13.3 % (ref 11.5–14.0)
Seg Neut %: 62.6 % (ref 40–74)
WBC: 4.8 10*3/uL (ref 4.0–10.5)
nRBC: 0.1 % (ref 0.0–2.0)

## 2022-07-17 LAB — SEDIMENTATION RATE, AUTOMATED: Sedimentation Rate: <1 mm/hr (ref 0–33)

## 2022-07-22 ENCOUNTER — Encounter: Payer: Self-pay | Admitting: Rheumatology

## 2022-07-22 ENCOUNTER — Ambulatory Visit: Payer: Medicare (Managed Care) | Attending: Rheumatology | Admitting: Rheumatology

## 2022-07-22 VITALS — BP 118/60 | HR 66 | Temp 97.0°F | Resp 16 | Ht 66.5 in | Wt 157.6 lb

## 2022-07-22 DIAGNOSIS — M059 Rheumatoid arthritis with rheumatoid factor, unspecified: Secondary | ICD-10-CM | POA: Insufficient documentation

## 2022-07-22 DIAGNOSIS — M8580 Other specified disorders of bone density and structure, unspecified site: Secondary | ICD-10-CM | POA: Insufficient documentation

## 2022-07-22 NOTE — Progress Notes (Signed)
Rheumatology Follow-Up Note     PRIMARY CARE PHYSICIAN: Warden Fillers, MD    Subjective     HPI:   Terry Harrison is a 72 y.o. year old female who is here for follow up of rheumatoid arthritis/osteopenia.  [name] comes in today for routine evaluation for the standpoint of rheumatoid arthritis doing excellent.  She denies any specific areas joint difficulty reports no limitations in life due to arthritis pain.  She continues on a reduced dose of Actemra at a dose of 162 mg every other week.  She has had no" flares" of arthritis which is mandated steroid intervention.  She does have a Medrol Dosepak available over the last several years but has not utilized such.  Relative to osteopenia she continues on strontium and is been reluctant to consider pharmacologic management of her osteopenia.  Equally noted is been a fairly significant improvement in bone mineralization since initiation of strontium in 2017.  Each subsequent BMD evaluation has shown some degree of improvement.  However the most recent BMD dated April 2024 does reflect a substantial decline in bone mineralization at the spine from a T-score of -0.8 to -1.7.  T-scores at the right and left total hip however have improved.  A T-score at the forearm was done for the first time and found to be abnormal at -2.9.  There is been a consistent trend in her overall FRAX assessment since 2017 as well.  Her current FRAX assessment equates to 13.4/2.4% respectively.  We had a lengthy discussion today surrounding NOF recommendations and lieu of her" osteoporotic" T-score at the forearm he had a very acceptable FRAX assessment.  The standpoint of rheumatoid arthritis she is on excellent.  Recent laboratory assessments reflect a strongly positive CCP and normal sedimentation rate.  Remaining metabolic profile is negative or normal.    Current Meds:  Current Outpatient Medications   Medication Sig    tocilizumab (ACTEMRA) 162 MG/0.9ML prefilled syringe inject  subcutaneously 1 syringe every other week based on current weight (thru company)    atorvastatin (LIPITOR) 20 mg tablet TAKE 1 TABLET BY MOUTH ONCE DAILY    Ascorbic Acid (VITAMIN C PO) Take 1,000 mg by mouth daily    Strontium Chloride CRYS By no specified route  680MG  TABLETS    Menaquinone-7 (VITAMIN K2) 100 MCG CAPS     aspirin 81 MG tablet Take 1 tablet (81 mg total) by mouth daily    Calcium Carbonate (CALCIUM 600 PO) Take by mouth       cholecalciferol (VITAMIN D) 1000 UNIT tablet Take 1 tablet (1,000 units total) by mouth daily.    methylPREDNISolone (MEDROL) 4 MG tablet pack  (Patient not taking: Reported on 01/23/2022)    nitroglycerin (NITROSTAT) 0.4 MG SL tablet Place 1 tablet (0.4 mg total) under the tongue every 5 minutes as needed for Chest pain   May repeat 2 times then call 911 if pain persists. (Patient not taking: Reported on 01/23/2022)        PAST MEDICAL HISTORY: Reviewed and updated as needed.   FAMILY HISTORY: Reviewed and updated as needed.   SOCIAL HISTORY: Reviewed and updated as needed.     REVIEW of SYSTEMS:  Rheumatology Review of Systems:    PHYSICAL EXAMINATION:  BP 118/60 (BP Location: Left arm, Patient Position: Sitting)   Pulse 66   Temp 36.1 C (97 F) (Temporal)   Resp 16   Ht 1.689 m (5' 6.5")   Wt 71.5 kg (157 lb 9.6  oz)   BMI 25.06 kg/m   Body mass index is 25.06 kg/m.  Pain    07/22/22 1420   PainSc:   0 - No pain     Physical Exam  Hands have no gross stigmata consistent with RA.  No synovitis seen about the MCP or IP joints.  Wrist exhibit limited dorsiflexion bilaterally greater on left than right no synovitis or effusion detected.  Elbows exhibit no flexion contractures or nodularity.  Shoulder passive range of motion painless and within normal limits.  Knees have no joint space tenderness, synovitis, warmth or effusion.      PRIOR STUDIES:  Labs: - Recent labs reviewed -- please see ERecord results review section for values.     Radiology:   -Recent imaging  reports reviewed -- please see ERecord imaging section for details.       Assessment   IMPRESSION:    ICD-10-CM ICD-9-CM    1. Rheumatoid arthritis with positive rheumatoid factor, involving unspecified site  M05.9 714.0 CBC      Comprehensive metabolic panel      Sedimentation rate, automated      2. Osteopenia, unspecified location  M85.80 733.90             RECOMMENDATIONS:    Rheumatoid arthritis (CCP positive) continue Actemra reduced doses of every other week.  Continues to do well with no significant inflammatory features reported per history or seen by physical examination.  Tolerating Actemra well free of any interruptions due to infectious complications or other medical concerns.  Laboratory assessments unremarkable and will be repeated prior to our follow-up evaluation in 6 months.  Osteopenia.  There certainly has been some discrepancy seen at the lumbosacral spine.  This may be artifactual as BMD was undertaken at a different facility.  Equally, this is the first such time we have seen T-scores expressed at the forearm.  I do feel her T-score at the lumbosacral spine is artifactually abnormal.  There is a trend towards continued improvement of both right and left hip as well as a continued trend towards reduction and FRAX assessment despite aging over the last 6 years.  Ultimately elected on continuing our course of observation and continuing strontium in conjunction with supplemental vitamin D and calcium.  NOF recommendations were discussed at length relative to her FRAX assessment and T-scores at the forearm.  RTC 6 months.

## 2022-07-22 NOTE — Patient Instructions (Signed)
You are required to notify this office immediately if there have been any insurance coverage changes that could effect your current treatment. Failure to do so could delay your care.

## 2022-07-25 ENCOUNTER — Ambulatory Visit: Payer: Medicare (Managed Care) | Admitting: Rheumatology

## 2022-08-15 ENCOUNTER — Ambulatory Visit: Payer: Medicare (Managed Care) | Admitting: Cardiology

## 2022-08-15 ENCOUNTER — Other Ambulatory Visit: Payer: Self-pay

## 2022-08-15 VITALS — BP 118/73 | HR 63 | Ht 66.5 in | Wt 153.0 lb

## 2022-08-15 DIAGNOSIS — Z9861 Coronary angioplasty status: Secondary | ICD-10-CM

## 2022-08-15 DIAGNOSIS — Z789 Other specified health status: Secondary | ICD-10-CM

## 2022-08-15 DIAGNOSIS — I2510 Atherosclerotic heart disease of native coronary artery without angina pectoris: Secondary | ICD-10-CM

## 2022-08-15 LAB — EKG 12-LEAD
P: 45 deg
PR: 125 ms
QRS: 62 deg
QRSD: 82 ms
QT: 426 ms
QTc: 438 ms
Rate: 63 {beats}/min
T: 68 deg

## 2022-08-15 NOTE — Progress Notes (Signed)
Dear Dr. Warnell Forester:     Terry Harrison, a 72 y.o. Female presents for follow up of  coronary artery disease with previous PCI .    HPI: Terry Harrison seems to doing well overall.  She does not complain of chest discomfort or shortness of breath with usual activities.  She denies palpitations.  She denies orthopnea or PND.  She does notice mild ankle swelling bilaterally by the end of each day, which seems to improve overnight after being supine.  The edema is not painful, and does not seem to be significantly worsening.    Past Medical History:   Diagnosis Date    Breast cancer 2000    CAD (coronary artery disease) 07/23/2017    Rheumatoid arthritis 07/2011     Past Surgical History:   Procedure Laterality Date    CARDIAC CATHETERIZATION N/A 07/23/2017    Procedure: Coronary Angio with Possible PTCA;  Surgeon: Shon Millet, MD;  Location: [facility] CARDIAC CATH LABS;  Service: Cardiovascular    CARDIAC CATHETERIZATION N/A 07/23/2017    Procedure: Stent DES - Coronary;  Surgeon: Shon Millet, MD;  Location: [facility] CARDIAC CATH LABS;  Service: Cardiovascular    KNEE ARTHROSCOPY Left 2014    knee arthroscopy Right 2000    MASTECTOMY, MODIFIED RADICAL Left 2000    PR CORRECTION HAMMERTOE Right 08/01/2015    Procedure: TOE HAMMERTOE CORRECTION 2nd toe;  Surgeon: Marin Shutter, DPM;  Location: [facility];  Service: Podiatry    PR CORRJ HLX VLGS BNCTY SESMDC DSTL METAR OSTEOT Right 08/01/2015    Procedure: CHEVRON BUNIONECTOMY;  Surgeon: Marin Shutter, DPM;  Location: [facility];  Service: Podiatry    PR ENDOLUMINAL CORONARY IVUS OCT I&R INITIAL VESSEL N/A 07/23/2017    Procedure: Intravascular Ultrasound;  Surgeon: Shon Millet, MD;  Location: [facility] CARDIAC CATH LABS;  Service: Cardiovascular    PR RT/LT HEART CATHETERS N/A 07/23/2017    Procedure: Percutaneous Coronary Intervention;  Surgeon: Shon Millet, MD;   Location: [facility] CARDIAC CATH LABS;  Service: Cardiovascular       ROS: 10 point review of systems otherwise negative.    Allergies: Contac cold+flu night [dm-doxylamine-acetaminophen] and Hydroxychloroquine    Medications:  Patient's Medications   New Prescriptions    No medications on file   Previous Medications    ASCORBIC ACID (VITAMIN C PO)    Take 1,000 mg by mouth daily    ASPIRIN 81 MG TABLET    Take 1 tablet (81 mg total) by mouth daily    ATORVASTATIN (LIPITOR) 20 MG TABLET    TAKE 1 TABLET BY MOUTH ONCE DAILY    CALCIUM CARBONATE (CALCIUM 600 PO)    Take by mouth       CHOLECALCIFEROL (VITAMIN D) 1000 UNIT TABLET    Take 1 tablet (1,000 units total) by mouth daily.    MENAQUINONE-7 (VITAMIN K2) 100 MCG CAPS        NITROGLYCERIN (NITROSTAT) 0.4 MG SL TABLET    Place 1 tablet (0.4 mg total) under the tongue every 5 minutes as needed for Chest pain   May repeat 2 times then call 911 if pain persists.    STRONTIUM CHLORIDE CRYS    By no specified route  680MG  TABLETS    TOCILIZUMAB (ACTEMRA) 162 MG/0.9ML PREFILLED SYRINGE    inject subcutaneously 1 syringe every other week based on current weight (thru company)   Modified Medications    No medications on file   Discontinued Medications    METHYLPREDNISOLONE (  MEDROL) 4 MG TABLET PACK            Vital Signs:  Blood pressure 118/73, pulse 63, height 1.689 m (5' 6.5"), weight 69.4 kg (153 lb), SpO2 97%.  Body mass index is 24.32 kg/m.     CONSTITUTIONAL:  Pleasant female, conversant, NAD.  NEURO:  Alert and oriented, in no acute distress.  Grossly intact.  HEENT:  Oropharynx clear, mucous membranes moist, anicteric sclerae  LYMPH:  Supple without lymphadenopathy.     CARDIOVASCULAR:   A comprehensive cardiovascular exam was performed with details as follows:  Regular rate and rhythm, normal S1, S2, no S3 or S4.  There are no murmurs appreciated.  PMI is normal in position and character.  2+ radial and dorsalis pedis pulses bilaterally with no lower extremity edema  noted. JVP is approximately 5 cm above the right atrium.  There are no carotid bruits noted bilaterally.    RESP:  Clear to auscultation bilaterally without wheezes, rales or rhonchi.  GI:  Soft, nontender, normal active bowel sounds, nondistended.  No abdominal bruits noted.  MUSCULOSKELETAL:  No joint effusions or asymmetry.  SKIN:  No rashes or lesions noted.     Cholesterol Profile:  No results found for: "CHOL", "HDL", "LDLC", "TRIG", "[facility]"   Lab Results   Component Value Date    NA 142 07/17/2022    K 4.1 07/17/2022    CL 108 (H) 07/17/2022    CO2 28.9 07/17/2022    UN 21.0 07/17/2022    GLU 87 07/17/2022    GFR 76.9 07/17/2022     12 Lead ECG: Sinus rhythm 63 bpm.  Normal axis and intervals.    Reason for ECG:   CAD .      Assessment: Terry Harrison is a 72 y.o. Female with history of rheumatoid arthritis and coronary artery disease with previous PCI to the LAD (April 2019).  She continues to do well overall, with no anginal symptoms.  Her ECG and cardiac exam are benign.  She does have just mild ankle edema, most notable by the end of the day, compatible with mild venous insufficiency.  She does not endorse any symptoms to suggest heart failure.    Plan:   1.  CAD with previous PCI- We reviewed her medications, and elected to make no changes today.  Her blood pressure is normal.  I did not recommend a diuretic for her mild ankle swelling, and we discussed the entity of venous insufficiency.  I did not suggest additional cardiac testing at this time.    I will see her in follow-up in 1 year, sooner if needed.    Thank you for the opportunity of sharing in the care of this patient.  We look forward to working with you in the future.    Honor Junes, MD  [facility]

## 2022-09-03 ENCOUNTER — Telehealth: Payer: Self-pay

## 2022-09-03 NOTE — Telephone Encounter (Signed)
Patient called stating she has been dealing with potential flareups.

## 2022-09-04 ENCOUNTER — Telehealth: Payer: Self-pay

## 2022-09-04 NOTE — Telephone Encounter (Signed)
Pt states she is having flare ups and would like a nurse or a provider to call her back when they get a chance.

## 2022-09-05 ENCOUNTER — Telehealth: Payer: Self-pay | Admitting: Rheumatology

## 2022-09-05 DIAGNOSIS — M059 Rheumatoid arthritis with rheumatoid factor, unspecified: Secondary | ICD-10-CM

## 2022-09-05 MED ORDER — PREDNISONE 5 MG PO TABS *I*
5.0000 mg | ORAL_TABLET | Freq: Every day | ORAL | 1 refills | Status: DC
Start: 2022-09-05 — End: 2023-09-08

## 2022-09-05 NOTE — Telephone Encounter (Signed)
Discussed with patient.  Having significant increase in generalized discomfort and swelling of the hands knees and feet.  She is currently on Actemra 162 mg every other week.  After discussion elected upon the following.  Complete existing Medrol Dosepak that she is currently admist.  Following this begin prednisone 5 mg daily.  Following this we will proceed with authorization for weekly Actemra administration.  At that time she will initiate Actemra 162 mg on a weekly basis.  She also will require a sooner office visit in October.  Connie-can we please proceed with authorization for weekly Actemra 162 mg.  Also could we reschedule for middle to end of July for evaluation.  She will continue prednisone 5 mg daily throughout this time.  Prescription for prednisone sent to her pharmacy.

## 2022-09-05 NOTE — Telephone Encounter (Signed)
Called and spoke to patient and she noticed in the past 2 weeks.. having bilateral hand, wrist pain with swelling, pain behind her left knee.  Bilateral foot pain at night with waking and walking at night.  She started the prednisone dose pack and noticed reduction in pain and swelling in hands ,wrist and left knee within the first 2 days. Now the pain is increasing with the lower doses. She is questioning if she needs to reevaluate the medication with being on actemra for 5+ years. Please give her a call . Thank you.

## 2022-09-08 ENCOUNTER — Other Ambulatory Visit: Payer: Self-pay

## 2022-09-08 ENCOUNTER — Other Ambulatory Visit: Payer: Self-pay | Admitting: Pharmacist

## 2022-09-08 DIAGNOSIS — M069 Rheumatoid arthritis, unspecified: Secondary | ICD-10-CM

## 2022-09-08 MED ORDER — ACTEMRA 162 MG/0.9ML SC SOSY
PREFILLED_SYRINGE | SUBCUTANEOUS | 2 refills | Status: DC
Start: 2022-09-08 — End: 2023-06-08

## 2022-09-16 ENCOUNTER — Other Ambulatory Visit: Payer: Self-pay | Admitting: Cardiology

## 2022-09-16 NOTE — Telephone Encounter (Signed)
Last OV - 08/15/2022  Recommended FUV - 1 year        Lab results: 07/17/22  0740 10/30/21  0934   Sodium 142 143   Potassium 4.1 3.8   Chloride 108* 106   CO2 28.9 30.7   UN 21.0 26.0*   Creatinine 0.78 0.87   Glucose 87 89   Calcium 9.1 9.2   Total Protein 6.1 5.9   Albumin 3.5 3.6   AST 22 24   Alk Phos 82 82   Bilirubin,Total 1.0 1.6*     No results for input(s): "CHOL", "HDL", "LDLC", "LDL", "TRIG", "NHDLC", "[facility]HDC" in the last 8760 hours.

## 2022-09-17 ENCOUNTER — Telehealth: Payer: Self-pay

## 2022-09-17 NOTE — Telephone Encounter (Signed)
Called and spoke to [facility] and they spoke to insurance and will not need a new auth for the actemra injections freq. To go weekly and they will send patient injections when she is due.

## 2022-10-10 ENCOUNTER — Other Ambulatory Visit: Payer: Self-pay | Admitting: Rheumatology

## 2022-10-10 ENCOUNTER — Telehealth: Payer: Self-pay

## 2022-10-10 DIAGNOSIS — M059 Rheumatoid arthritis with rheumatoid factor, unspecified: Secondary | ICD-10-CM

## 2022-10-10 NOTE — Telephone Encounter (Signed)
Pt. Has an upcoming appt that was an add on and was wondering if she needed to have labs done prior to the appt on 7/24.    Please advise.

## 2022-10-17 ENCOUNTER — Other Ambulatory Visit: Payer: Self-pay | Admitting: Rheumatology

## 2022-10-17 ENCOUNTER — Encounter: Payer: Self-pay | Admitting: Gastroenterology

## 2022-10-17 LAB — COMPREHENSIVE METABOLIC PANEL
ALT: 24 U/L (ref 10–49)
AST: 20 U/L (ref 0–34)
Albumin: 3.6 g/dL (ref 3.4–5.0)
Alk Phos: 72 U/L (ref 46–116)
Anion Gap: 4.5 (ref 4–13)
Bilirubin,Total: 1.3 mg/dL — ABNORMAL HIGH (ref 0.2–1.2)
CO2: 30.5 mmol/L (ref 20.0–31.0)
Calcium: 9.6 mg/dL (ref 8.7–10.4)
Chloride: 109 mmol/L — ABNORMAL HIGH (ref 98–107)
Creatinine: 0.9 mg/dL (ref 0.55–1.02)
Glomerular Filtration Rate: 65.2 mL/min (ref 60–999)
Glucose: 88 mg/dL (ref 74–106)
Lab: 16 mg/dL (ref 9.0–23.0)
Potassium: 4 mmol/L (ref 3.5–5.1)
Sodium: 144 mmol/L (ref 136–145)
Total Protein: 5.7 g/dl (ref 5.7–8.2)

## 2022-10-20 LAB — CBC
Baso # K/uL: 0 10*3/uL (ref 0.0–0.2)
Basophil %: 0.7 % (ref 0–2)
Eos # K/uL: 0.1 10*3/uL (ref 0.0–0.6)
Eosinophil %: 2.8 % (ref 0–7)
Hematocrit: 43.9 % (ref 37.0–47.0)
Hemoglobin: 14.8 gm/dL (ref 12.0–15.0)
Lymph # K/uL: 1.1 10*3/uL (ref 1.0–3.5)
Lymphocyte %: 25.9 % (ref 19–48)
MCH: 32 pg (ref 26–34)
MCHC: 33.7 g/dL (ref 32–36)
MCV: 94.8 fL (ref 78.0–100.0)
Mean Platelet Volume: 8.9 fl (ref 7.3–9.3)
Mono # K/uL: 0.5 10*3/uL (ref 0.0–1.0)
Monocyte %: 11.2 % — ABNORMAL HIGH (ref 3–9)
Neut # K/uL: 2.5 10*3/uL (ref 1.5–6.6)
Platelets: 146 10*3/uL — ABNORMAL LOW (ref 150–450)
RBC: 4.63 M/uL (ref 3.60–5.00)
RDW: 13.4 % (ref 11.5–14.0)
Seg Neut %: 59.4 % (ref 40–74)
WBC: 4.2 10*3/uL (ref 4.0–10.5)
nRBC: 0.1 % (ref 0.0–2.0)

## 2022-10-20 LAB — SEDIMENTATION RATE, AUTOMATED: Sedimentation Rate: <1 mm/hr (ref 0–33)

## 2022-10-29 ENCOUNTER — Encounter: Payer: Self-pay | Admitting: Rheumatology

## 2022-10-29 ENCOUNTER — Ambulatory Visit: Payer: Medicare (Managed Care) | Attending: Rheumatology | Admitting: Rheumatology

## 2022-10-29 ENCOUNTER — Other Ambulatory Visit: Payer: Self-pay

## 2022-10-29 VITALS — BP 116/72 | HR 89 | Temp 97.4°F | Resp 16 | Ht 66.5 in | Wt 157.9 lb

## 2022-10-29 DIAGNOSIS — M175 Other unilateral secondary osteoarthritis of knee: Secondary | ICD-10-CM | POA: Insufficient documentation

## 2022-10-29 DIAGNOSIS — M059 Rheumatoid arthritis with rheumatoid factor, unspecified: Secondary | ICD-10-CM | POA: Insufficient documentation

## 2022-10-29 DIAGNOSIS — D696 Thrombocytopenia, unspecified: Secondary | ICD-10-CM

## 2022-10-29 DIAGNOSIS — M8580 Other specified disorders of bone density and structure, unspecified site: Secondary | ICD-10-CM | POA: Insufficient documentation

## 2022-10-29 MED ORDER — METHYLPREDNISOLONE ACETATE 80 MG/ML IJ SUSP *I*
80.0000 mg | Freq: Once | INTRAMUSCULAR | Status: AC | PRN
Start: 2022-10-29 — End: 2022-10-29
  Administered 2022-10-29: 80 mg via INTRA_ARTICULAR

## 2022-10-29 MED ORDER — LIDOCAINE HCL 1 % IJ SOLN *I*
0.5000 mL | Freq: Once | INTRAMUSCULAR | Status: AC | PRN
Start: 2022-10-29 — End: 2022-10-29
  Administered 2022-10-29: .5 mL via INTRA_ARTICULAR

## 2022-10-29 NOTE — Progress Notes (Signed)
Rheumatology Follow-Up Note     PRIMARY CARE PHYSICIAN: Warden Fillers, MD    Subjective     HPI:   Terry Harrison is a 72 y.o. year old female who is here for follow up of rheumatoid arthritis.  Terry Harrison comes in today having had a recent" flare" affecting both hands, both feet and left knee.  This prompted a Medrol Dosepak as well as a telephone conversation.  Ultimately elected upon an increase in Actemra therapy to 162 weekly (previously 162 mg every other week), also low-dose prednisone was added to her regiment.  She has since gone on to discontinue low-dose prednisone approximately 2 weeks ago.  She is doing better with the increased frequency of Actemra.  She is reporting nearly complete resolution of her afore noted hand stiffness and swelling as well as foot stiffness and swelling.  She still having however persistent difficulties along the lateral aspect of her left knee.  Here she is seen no visible swelling.  She reports sensation of instability about the knee.  All other joints are doing well currently, off prednisone and with the increase in Actemra frequency which she has tolerated well.  Laboratory assessment however do reveal a slight trend towards worsening thrombocytopenia.  Remaining laboratory assessments are unremarkable.  Sedimentation rate remains virtually undetectable.  She intends on leaving for [place] in the month of November.  This is the first such significant flare she has had since the institution of Actemra.  There is a previous course of Kevzara therapy that was converted to Actemra by virtue of insurance constraints.    Current Meds:  Current Outpatient Medications   Medication Sig    atorvastatin (LIPITOR) 20 mg tablet TAKE ONE TABLET BY MOUTH ONCE DAILY    tocilizumab (ACTEMRA) 162 MG/0.9ML prefilled syringe inject subcutaneously 1 syringe every  week based on current weight (thru company)    Ascorbic Acid (VITAMIN C PO) Take 1,000 mg by mouth daily    Strontium Chloride CRYS By  no specified route  680MG  TABLETS    Menaquinone-7 (VITAMIN K2) 100 MCG CAPS     aspirin 81 MG tablet Take 1 tablet (81 mg total) by mouth daily    Calcium Carbonate (CALCIUM 600 PO) Take by mouth       cholecalciferol (VITAMIN D) 1000 UNIT tablet Take 1 tablet (1,000 units total) by mouth daily.    predniSONE (DELTASONE) 5 mg tablet Take 1 tablet (5 mg total) by mouth daily. (Patient not taking: Reported on 10/29/2022)    nitroglycerin (NITROSTAT) 0.4 MG SL tablet Place 1 tablet (0.4 mg total) under the tongue every 5 minutes as needed for Chest pain   May repeat 2 times then call 911 if pain persists. (Patient not taking: Reported on 01/23/2022)        PAST MEDICAL HISTORY: Reviewed and updated as needed.   FAMILY HISTORY: Reviewed and updated as needed.   SOCIAL HISTORY: Reviewed and updated as needed.     REVIEW of SYSTEMS:  Rheumatology Review of Systems:    PHYSICAL EXAMINATION:  BP 116/72 (BP Location: Left arm, Patient Position: Sitting)   Pulse 89   Temp 36.3 C (97.4 F) (Temporal)   Resp 16   Ht 1.689 m (5' 6.5")   Wt 71.6 kg (157 lb 14.4 oz)   BMI 25.10 kg/m   Body mass index is 25.1 kg/m.  Pain    10/29/22 1126   PainSc:   2   PainLoc: Knee  Physical Exam  Hands exhibit no visible synovitis but the MCP or IP joints.  There is no joint tenderness.  Wrist range of motion was limited bilaterally much more so on left than right but there was no synovitis or effusion detected.  Elbows exhibit no synovitis or effusion.  There was no nodularity detected.  Shoulders were unremarkable.  Left knee did exhibit a positive McMurray's sign favoring tenderness over the left lateral meniscus.  Anterior posterior drawer sign were negative.  There is no medial joint space tenderness, synovitis, warmth or effusion detected.  Right knee was unremarkable.  Both knees exhibit mild crepitation with passive maneuvers.  Ankles are unremarkable.  Feet exhibit no tappable synovitis about the MTP joints no joint  tenderness.  Multiple rheumatoid deformities about the feet are appreciated.  Previous surgical interventions are noted.  No significant rashes are seen throughout the lower extremities with a trace amount of edema noted bilaterally.      PRIOR STUDIES:  Labs: - Recent labs reviewed -- please see ERecord results review section for values.     Radiology:   -No recent imaging results.       Assessment   IMPRESSION:    ICD-10-CM ICD-9-CM    1. Rheumatoid arthritis with positive rheumatoid factor, involving unspecified site  M05.9 714.0 Knee BILATERAL 4 views      CBC and differential      Comprehensive metabolic panel      Sedimentation rate, automated      2. Osteopenia, unspecified location  M85.80 733.90       3. Other secondary osteoarthritis of left knee  M17.5 715.26       4. Thrombocytopenia  D69.6 287.5             RECOMMENDATIONS:    Rheumatoid arthritis.  Doing better with recent increase in Actemra frequency to 162 mg weekly.  Continue same.  Remain off prednisone.  Overall improved with respect to significant flare that has transpired.  No significant synovitis seen on examination today.  Persistent pain left knee.  Possibly consistent with evolving osteoarthritis of the left knee versus potential left lateral meniscal tear.  Discussed options.  X-rays both knees are pending.  Injected left knee utilizing the medial approach with 80 mg Depo-Medrol.  Thrombocytopenia.  Possibly secondary to recent increase in Actemra therapy.  Continue to monitor.  Laboratory assessments pending prior to next evaluation in 3 months.  Osteopenia.  As per last office evaluation recent BMD reviewed.  BMD showing continued trend towards improvement admist her current homeopathic regiment of strontium.  Continue submental vitamin D and calcium.  Repeat BMD accordingly.  RTC 3 months prior to return to [place].

## 2022-10-29 NOTE — Patient Instructions (Signed)
You are required to notify this office immediately if there have been any insurance coverage changes that could effect your current treatment. Failure to do so could delay your care.

## 2022-10-29 NOTE — Procedures (Signed)
Large Joint Aspiration/Injection Procedure: L knee intra - articular    Date/Time: 10/29/2022 11:20 AM EDT  Consent given by: patient  Site marked: site marked  Timeout: Immediately prior to procedure a time out was called to verify the correct patient, procedure, equipment, support staff and site/side marked as required     Procedure Details    Location: knee - L knee intra - articular  Preparation: The site was prepped using the usual aseptic technique.  Anesthetics administered:  0.5 mL lidocaine HCL 1 %  Intra-Articular Steroids administered:  80 mg methylPREDNISolone acetate 80 MG/ML  Dressing:  A dry, sterile dressing was applied.  Patient tolerance: patient tolerated the procedure well with no immediate complications

## 2022-10-31 ENCOUNTER — Other Ambulatory Visit: Payer: Self-pay | Admitting: Gastroenterology

## 2022-11-25 ENCOUNTER — Telehealth: Payer: Self-pay | Admitting: Rheumatology

## 2022-11-25 NOTE — Telephone Encounter (Signed)
Patient phoned the office inquiring about her xray results.

## 2022-11-25 NOTE — Telephone Encounter (Signed)
Discussed with patient.  Reviewed x-ray findings.  X-rays of the left knee reflect" moderate" tricompartmental arthritis.  X-rays of right knee reflect" severe" tricompartmental arthritis.  She is improving following our injection.  No further imaging pending.  Keep scheduled office visit of October 30.

## 2022-12-29 ENCOUNTER — Other Ambulatory Visit: Payer: Self-pay | Admitting: Cardiology

## 2023-01-06 ENCOUNTER — Ambulatory Visit: Payer: Medicare (Managed Care) | Admitting: Rheumatology

## 2023-01-14 ENCOUNTER — Encounter: Payer: Self-pay | Admitting: Gastroenterology

## 2023-02-04 ENCOUNTER — Encounter: Payer: Self-pay | Admitting: Rheumatology

## 2023-02-04 ENCOUNTER — Ambulatory Visit: Payer: Medicare (Managed Care) | Attending: Rheumatology | Admitting: Rheumatology

## 2023-02-04 VITALS — BP 120/72 | HR 100 | Temp 97.3°F | Resp 16 | Ht 64.17 in | Wt 154.1 lb

## 2023-02-04 DIAGNOSIS — M059 Rheumatoid arthritis with rheumatoid factor, unspecified: Secondary | ICD-10-CM | POA: Insufficient documentation

## 2023-02-04 DIAGNOSIS — M175 Other unilateral secondary osteoarthritis of knee: Secondary | ICD-10-CM | POA: Insufficient documentation

## 2023-02-04 DIAGNOSIS — M8580 Other specified disorders of bone density and structure, unspecified site: Secondary | ICD-10-CM | POA: Insufficient documentation

## 2023-02-04 MED ORDER — METHYLPREDNISOLONE 4 MG PO TBPK *A*
ORAL_TABLET | ORAL | 1 refills | Status: DC
Start: 2023-02-04 — End: 2023-09-08

## 2023-02-04 NOTE — Patient Instructions (Signed)
You are required to notify this office immediately if there have been any insurance coverage changes that could effect your current infusion. Failure to do so could delay your care.

## 2023-02-08 NOTE — Progress Notes (Signed)
Rheumatology Follow-Up Note     PRIMARY CARE PHYSICIAN: Warden Fillers, MD    Subjective     HPI:   Terry Harrison is a 72 y.o. year old female who is here for follow up of rheumatoid arthritis.  Terry Harrison comes in today for routine evaluation generally doing well.  When last seen we had injected her left knee with excellent clinical improvement.  She is reporting slight recrudescence of mild discomfort with periods of protracted weightbearing but generally doing well.  Surprisingly, x-rays reviewed reflect more significant tricompartmental osteoarthritic changes of the right knee more so than the left.  There is a distant history of trauma to the right knee.  Otherwise she is doing generally well with no other specific joint difficulties offered and continues presently on Actemra 162 mg weekly.  Her former dosing regimen had been 162 every other week but given a significant flare that transpired nearly a year ago we have altered her dosing regimen to weekly and she has done generally quite well.  She continues on strontium pursuant to management of her osteopenia.  Most recent BMD is again reviewed with her did show significant clinical improvement from previous BMD studies in 2021.  Continue submental vitamin D and calcium with no falls or fracture events described.  She will be off to [place] here very shortly.  We spent some time today reviewing her most recent knee x-rays.  We briefly discussed the possibility of a reduction in Actemra frequency to every other week both in the context of her clinical stability and tolerance of Actemra on a weekly dosing regiment, no dosing alterations are suggested.  No interruptions in Actemra been noted by virtue of infectious complications or other medical concerns.  Current Meds:  Current Outpatient Medications   Medication Sig    atorvastatin (LIPITOR) 20 mg tablet TAKE ONE TABLET BY MOUTH ONCE DAILY    tocilizumab (ACTEMRA) 162 MG/0.9ML prefilled syringe inject  subcutaneously 1 syringe every  week based on current weight (thru company)    Ascorbic Acid (VITAMIN C PO) Take 1,000 mg by mouth daily    Strontium Chloride CRYS By no specified route  680MG  TABLETS    Menaquinone-7 (VITAMIN K2) 100 MCG CAPS     aspirin 81 MG tablet Take 1 tablet (81 mg total) by mouth daily    Calcium Carbonate (CALCIUM 600 PO) Take by mouth       cholecalciferol (VITAMIN D) 1000 UNIT tablet Take 1 tablet (1,000 units total) by mouth daily.    methylPREDNISolone (MEDROL PAK) 4 MG tablet pack Take according to package directions (6 day supply)    predniSONE (DELTASONE) 5 mg tablet Take 1 tablet (5 mg total) by mouth daily. (Patient not taking: Reported on 02/04/2023)    nitroglycerin (NITROSTAT) 0.4 MG SL tablet Place 1 tablet (0.4 mg total) under the tongue every 5 minutes as needed for Chest pain   May repeat 2 times then call 911 if pain persists. (Patient not taking: Reported on 02/04/2023)        PAST MEDICAL HISTORY: Reviewed and updated as needed.   FAMILY HISTORY: Reviewed and updated as needed.   SOCIAL HISTORY: Reviewed and updated as needed.     REVIEW of SYSTEMS:  Rheumatology Review of Systems:    PHYSICAL EXAMINATION:  BP 120/72 (BP Location: Left arm, Patient Position: Sitting)   Pulse 100   Temp 36.3 C (97.3 F) (Temporal)   Resp 16   Ht 1.63 m (5' 4.17")  Wt 69.9 kg (154 lb 1.6 oz)   BMI 26.31 kg/m   Body mass index is 26.31 kg/m.  Pain    02/04/23 1018   PainSc:   0 - No pain     Physical Exam  Hands exhibit no significant synovitis overlying MCP or IP joints.  Wrist range of motion was limited bilaterally worse on left than right however without significant swelling or effusion.  Elbows exhibit no flexion contractures, synovitis or nodularity.  Shoulders exhibit normal painless passive range of motion.  Both knees exhibit modest medial and lateral joint space tenderness but no significant warmth, synovitis or effusion were noted.  There is crepitation noted past  maneuvers of both knees with full range of motion.  Trace amount of peripheral edema seen about the lower extremity with no other concerning rashes identified.      PRIOR STUDIES:  Labs: - Recent labs reviewed -- please see ERecord results review section for values.     Radiology:   -Recent imaging reports reviewed -- please see ERecord imaging section for details.       Assessment   IMPRESSION:    ICD-10-CM ICD-9-CM    1. Rheumatoid arthritis with positive rheumatoid factor, involving unspecified site  M05.9 714.0 methylPREDNISolone (MEDROL PAK) 4 MG tablet pack      CBC      Comprehensive metabolic panel      Sedimentation rate, automated      CBC      Comprehensive metabolic panel      Sedimentation rate, automated      2. Osteopenia, unspecified location  M85.80 733.90             RECOMMENDATIONS:    Rheumatoid arthritis.  By all accounts doing well with Actemra.  Continue Actemra as monotherapy.  I did write a prescription for several Medrol Dosepaks in the event of any" flares" that may transpire in [place].  We will maintain routine laboratory monitoring on a 41-month interval with CBC, profile and sedimentation rate pending in 3 months and then prior to our intended follow-up visit in 6 months.  Osteopenia.  Showing significant improvement in bone mineralization with homeopathic regimens consisting of strontium.  Continue same in conjunction with current doses of supplemental vitamin D and calcium.  Intentions are to repeat bone density in April 2026.  No recent falls or fracture events described.  Secondary osteoarthritis of both knees.  Doing well following most recent injection.  Not requesting injection today.  X-ray findings reviewed.

## 2023-05-04 ENCOUNTER — Encounter: Payer: Self-pay | Admitting: Gastroenterology

## 2023-05-08 ENCOUNTER — Other Ambulatory Visit: Payer: Self-pay | Admitting: Gastroenterology

## 2023-05-08 IMAGING — MR MRI ABDOMEN W/WO CONTRAST
16 of 19 series · 36 of 48 positions shown · IV contrast (gadavist)
Comparison: None

________________________________________________________________________________________________ 
MRI ABDOMEN W/WO CONTRAST, 05/08/2023 [DATE]: 
CLINICAL INDICATION: Cyst Of Pancreas
TECHNIQUE: Multiplanar, multiecho position MR images of the abdomen were 
performed without and with intravenous enhancement. 6.5 mL of Gadavist were 
injected intravenously by hand. 1 mL of Gadavist discarded. Patient was scanned 
on a 1.5T magnet.

[Series 101: survey-head 1st · axial · 15.0mm · 1.76mm/px · 1 of 15 slices shown]
[im 1/15]
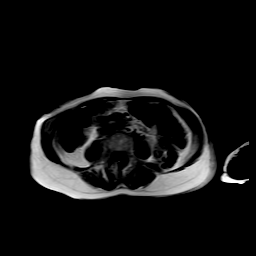

[Series 201: T2 · coronal · 5.0mm · 0.88mm/px · 1 of 36 slices shown]
[im 1/36]
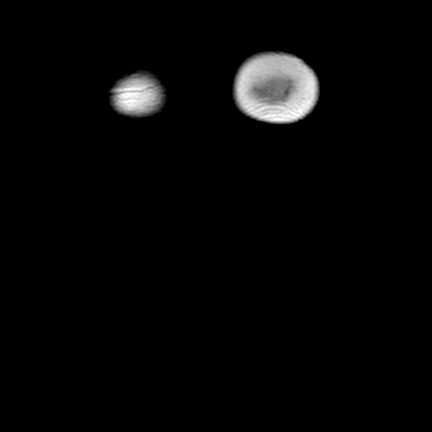

[Series 302: sout of phase · axial · 6.0mm · 1.13mm/px · 1 of 36 slices shown]
[im 1/36]
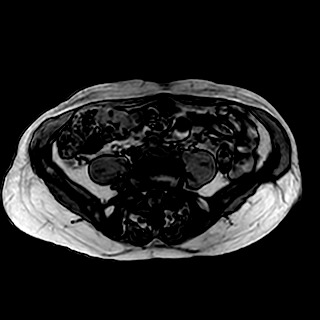

[Series 303: sin phase · axial · 6.0mm · 1.13mm/px · 1 of 36 slices shown]
[im 1/36]
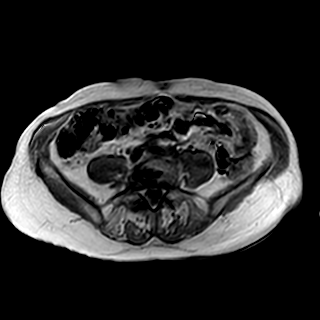

[Series 401: t2_ax_mvxd_hr_rt · axial · 5.0mm · 0.78mm/px · 1 of 42 slices shown]
[im 1/42]
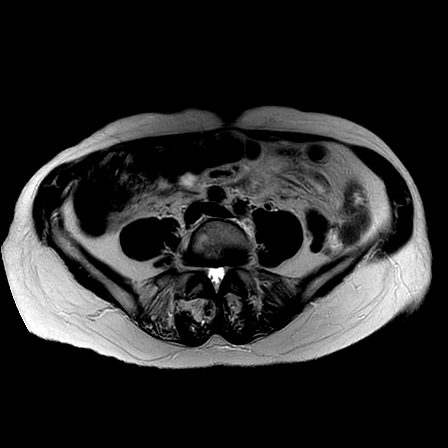

[Series 501: t2_spair mvxd_rt_fast · axial · 5.0mm · 0.88mm/px · 1 of 42 slices shown]
[im 1/42]
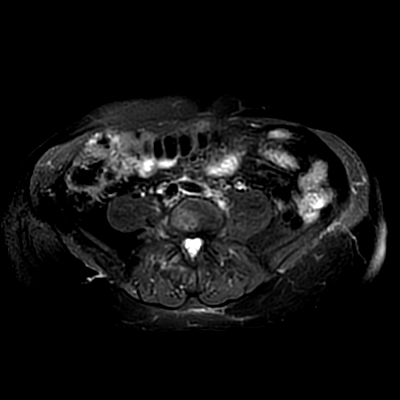

[Series 602: sbo · axial · 5.0mm · 1.57mm/px · 1 of 44 slices shown]
[im 1/44]
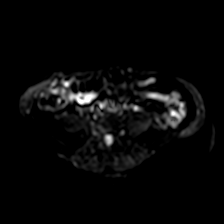

[Series 603: (id) · axial · 5.0mm · 1.57mm/px · 1 of 44 slices shown]
[im 1/44]
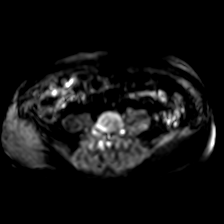

[Series 604: dadc 600 · axial · 5.0mm · 1.57mm/px · 1 of 44 slices shown]
[im 1/44]
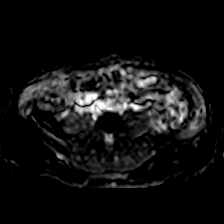

[Series 702: DIXON · axial · 4.0mm · 0.91mm/px · z∈[-89,+149]mm · 3 of 120 slices shown (1 of 5)]
[im 1/120]
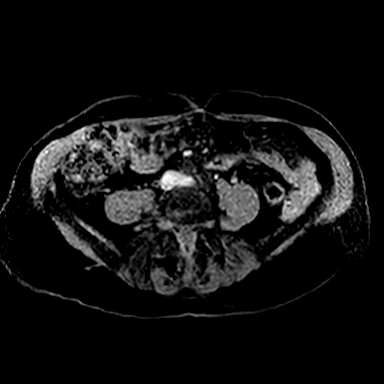
[im 60/120]
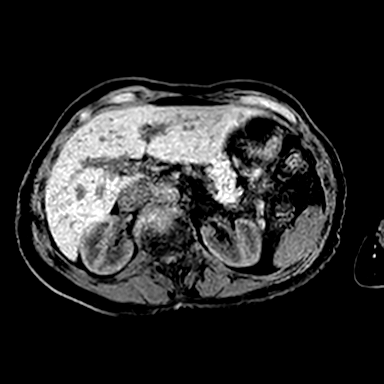
[im 120/120]
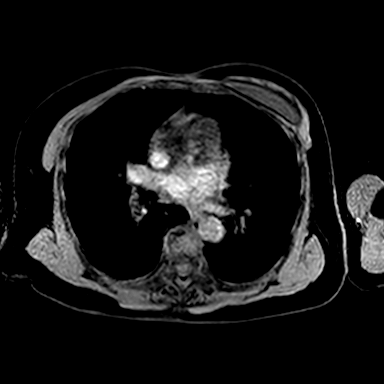

[Series 703: DIXON · axial · 4.0mm · 0.91mm/px · z∈[-89,+149]mm · 4 of 120 slices shown (2 of 5)]
[im 1/120]
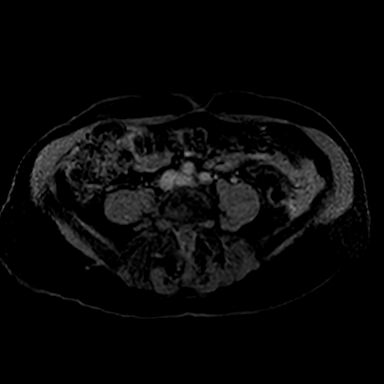
[im 40/120]
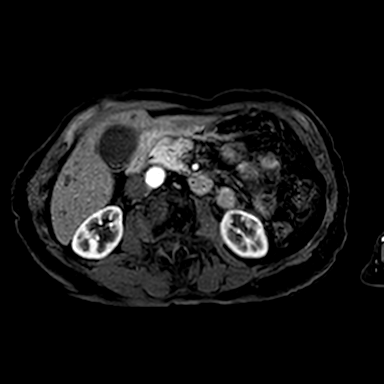
[im 80/120]
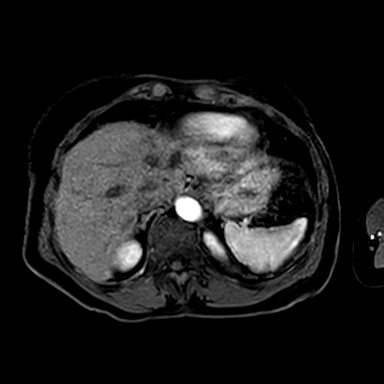
[im 120/120]
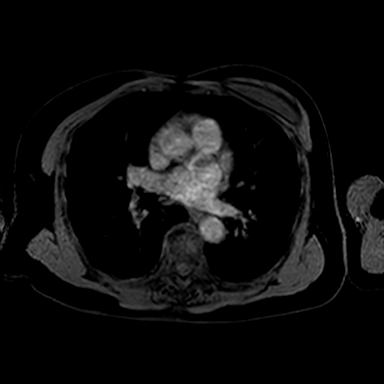

[Series 704: DIXON · axial · 4.0mm · 0.91mm/px · z∈[-89,+149]mm · 4 of 120 slices shown (3 of 5)]
[im 1/120]
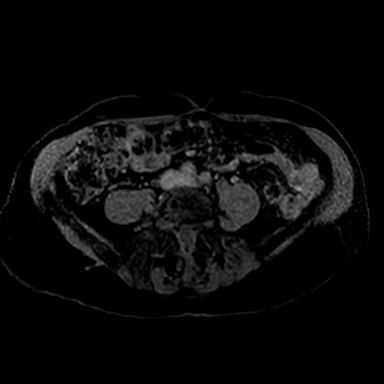
[im 40/120]
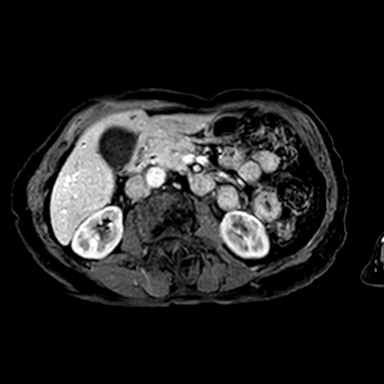
[im 80/120]
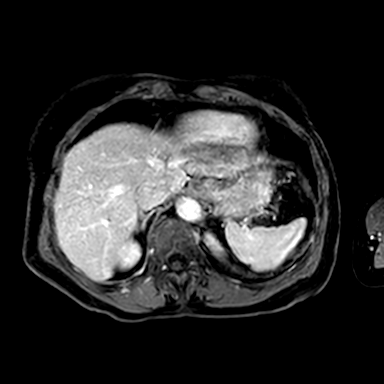
[im 120/120]
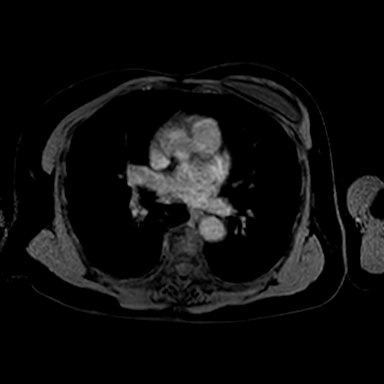

[Series 705: DIXON · axial · 4.0mm · 0.91mm/px · z∈[-89,+149]mm · 4 of 120 slices shown (4 of 5)]
[im 1/120]
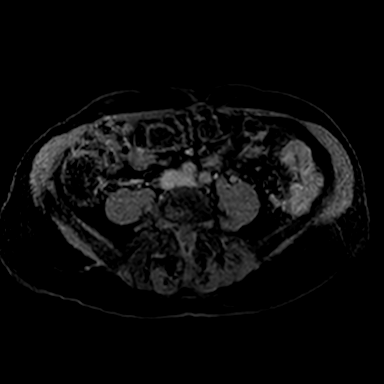
[im 40/120]
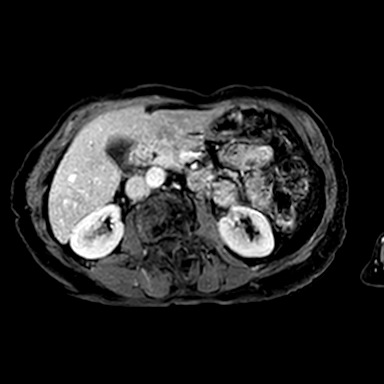
[im 80/120]
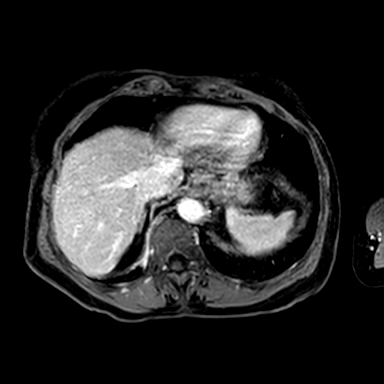
[im 120/120]
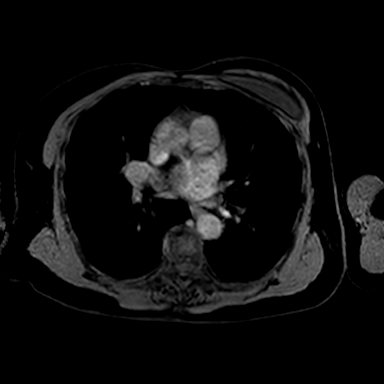

[Series 706: DIXON · axial · 4.0mm · 0.91mm/px · z∈[-89,+149]mm · 4 of 120 slices shown (5 of 5)]
[im 1/120]
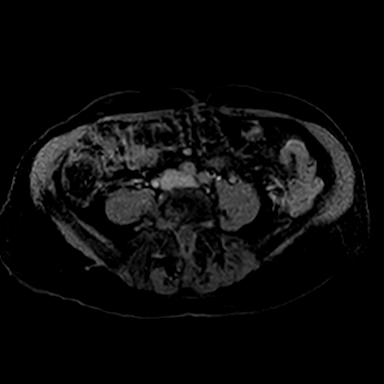
[im 40/120]
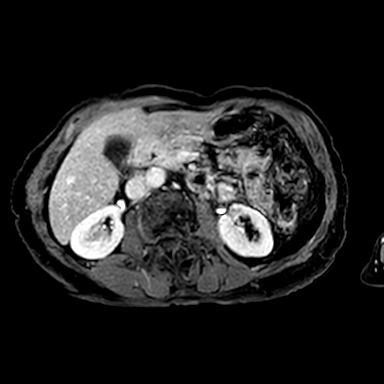
[im 80/120]
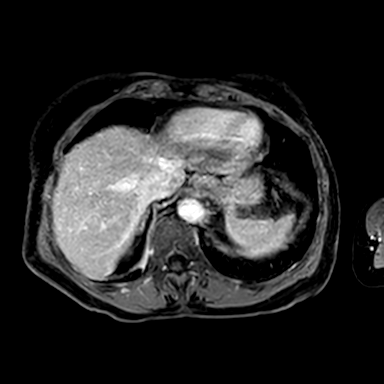
[im 120/120]
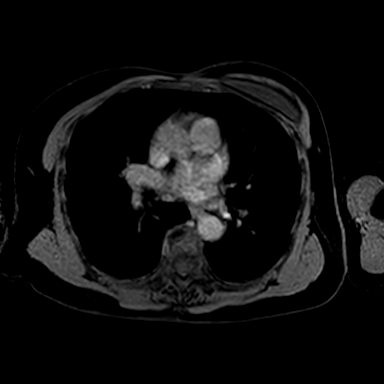

[Series 707: DIXON post-contrast · axial · 4.0mm · 0.91mm/px · z∈[-89,+149]mm · 4 of 120 slices shown (1 of 2)]
[im 1/120]
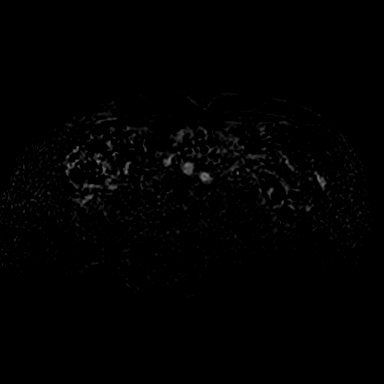
[im 40/120]
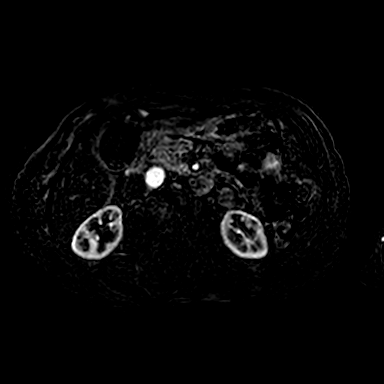
[im 80/120]
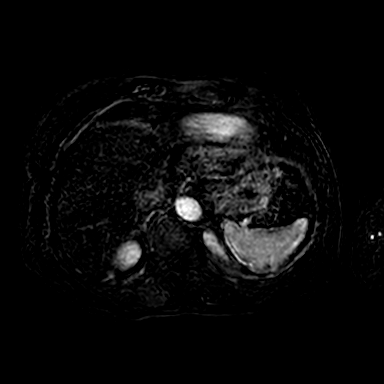
[im 120/120]
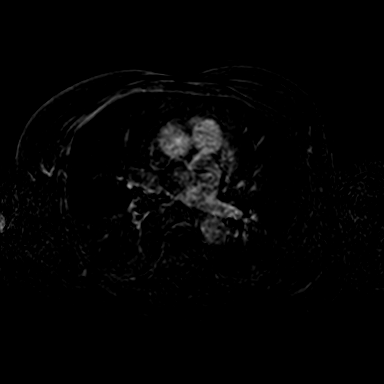

[Series 708: DIXON post-contrast · axial · 4.0mm · 0.91mm/px · z∈[-89,+149]mm · 4 of 120 slices shown (2 of 2)]
[im 1/120]
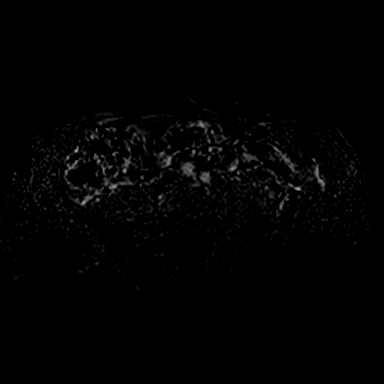
[im 40/120]
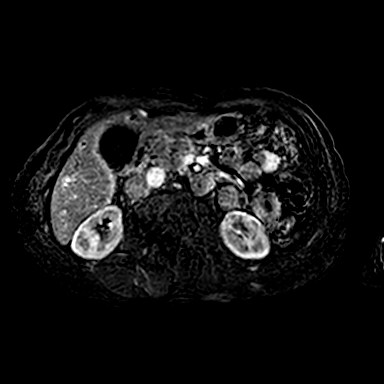
[im 80/120]
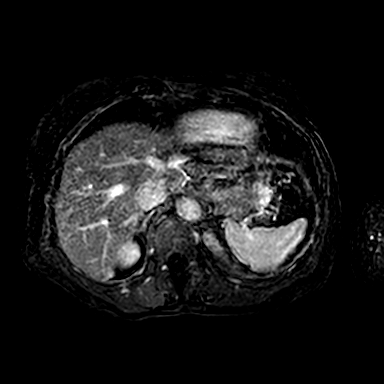
[im 120/120]
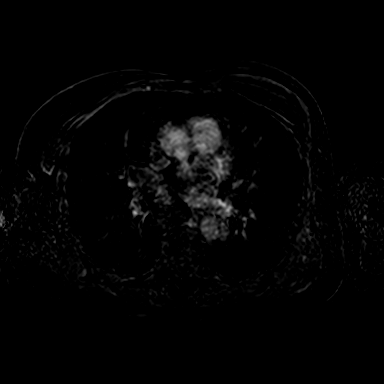

[36 of 48 positions shown; findings below may reference images not displayed]

FINDINGS: There is a macrolobulated cyst versus cyst with septation seen within 
the distal body the pancreas. On post contrast series 706, image 72 measuring 
upwards of 2.1 cm. No secondary pancreatic ductal dilatation. No mass or 
abnormal enhancement within the pancreas itself. 
The liver is normal in appearance. No mass. No biliary dilatation. Gallbladder 
reveals small gallstones. 
The spleen and adrenal glands are normal in appearance. 
Kidneys are unremarkable in appearance. No mass or hydronephrosis. No 
adenopathy. No free fluid. 
Degenerative changes and scoliosis in the spine. Left breast implant partially 
visualized.
IMPRESSION: 2.1 cm cyst with septation versus macrolobulated nonenhancing cyst without 
secondary suspicious characteristics. Recommend follow-up MRI in 12 months. 
Degenerative changes and postoperative changes. Otherwise no acute abnormality 
identified within the abdomen.

## 2023-06-08 ENCOUNTER — Other Ambulatory Visit: Payer: Self-pay

## 2023-06-08 DIAGNOSIS — M069 Rheumatoid arthritis, unspecified: Secondary | ICD-10-CM

## 2023-06-08 MED ORDER — ACTEMRA 162 MG/0.9ML SC SOSY
PREFILLED_SYRINGE | SUBCUTANEOUS | 2 refills | Status: DC
Start: 2023-06-08 — End: 2024-02-12

## 2023-07-29 ENCOUNTER — Other Ambulatory Visit: Payer: Self-pay | Admitting: Rheumatology

## 2023-08-05 ENCOUNTER — Ambulatory Visit: Payer: Medicare (Managed Care) | Attending: Rheumatology | Admitting: Rheumatology

## 2023-08-05 ENCOUNTER — Encounter: Payer: Self-pay | Admitting: Rheumatology

## 2023-08-05 ENCOUNTER — Other Ambulatory Visit: Payer: Self-pay

## 2023-08-05 VITALS — BP 118/74 | HR 78 | Temp 97.3°F | Resp 16 | Ht 64.0 in | Wt 152.0 lb

## 2023-08-05 DIAGNOSIS — M8580 Other specified disorders of bone density and structure, unspecified site: Secondary | ICD-10-CM | POA: Insufficient documentation

## 2023-08-05 DIAGNOSIS — M059 Rheumatoid arthritis with rheumatoid factor, unspecified: Secondary | ICD-10-CM | POA: Insufficient documentation

## 2023-08-05 DIAGNOSIS — M175 Other unilateral secondary osteoarthritis of knee: Secondary | ICD-10-CM | POA: Insufficient documentation

## 2023-08-05 DIAGNOSIS — I2510 Atherosclerotic heart disease of native coronary artery without angina pectoris: Secondary | ICD-10-CM | POA: Insufficient documentation

## 2023-08-05 NOTE — Patient Instructions (Signed)
 Please let us know if you have a prescription coverage besides your regular insurance.    Our office does not get notified of insurance changes automatically. You are required to notify this office immediately if there have been any insurance changes that could effect your current treatment. You should also let the office know if you have a separate  or specialty drug Prescription coverage. Failure to do so could delay your care.     We also encourage all patients to sign up for MyChart.  This allows for appointment reminders, communication and can allow for TeleHealth visits, if one is necessary.     You may receive a survey regarding your visit today with [place]. Please take the time to fill it out as we appreciate the feedback.           If you are experiencing any of the following; current illness, fever, start of antibiotics in the last 48 hours, pending surgery or any wounds, you are required to contact our office as soon as possible as this could potentially effect your scheduled infusion.

## 2023-08-06 NOTE — Progress Notes (Signed)
 Rheumatology Follow-Up Note     PRIMARY CARE PHYSICIAN: Reatha Soman, MD    Subjective     HPI:   Terry Harrison is a 73 y.o. year old female who is here for follow up of rheumatoid arthritis/osteoporosis.  Terry Harrison comes in today upon return from [place]  doing very well.  She continues presently on Actemra  162 mg weekly.  There is been no interruptions in Actemra  by virtue of infectious complications other medical concerns.  She is offering no limitations in life due to arthritis pain.  She does report intermittent left knee pain but this is very marginal nature.  Particular only with periods of first arising.  There is no pain with protracted walking on flat ground or other activities such as steps, stairs, etc.  It had responded well to injection nearly 1 year ago.  She is not requesting injection again.  Afore noted sites of inflammatory joint pain including hands and wrist are doing well.  Relative to her history of osteoporosis she continues on strontium.  She has been reluctant to consider medical intervention such as bisphosphonates or Prolia in the past.  However, BMD's have shown serial improvement with her homeopathic remedies including supplemental vitamin D and calcium .  Her definition of osteoporosis demonstrate T-score at the forearm of -2.9.  Her FRAX assessment however is relatively normal at 13.6/2.4% respectively.  She did not seek rheumatologic attention in [place]  however routine laboratory assessments reviewed are within normal limits.    Current Meds:  Current Outpatient Medications   Medication Sig    tocilizumab  (ACTEMRA ) 162 MG/0.9ML prefilled syringe inject subcutaneously 1 syringe every  week based on current weight (thru company)    atorvastatin  (LIPITOR) 20 mg tablet TAKE ONE TABLET BY MOUTH ONCE DAILY    Ascorbic Acid  (VITAMIN C  PO) Take 1,000 mg by mouth daily    Strontium Chloride CRYS By no specified route  680MG  TABLETS    Menaquinone-7 (VITAMIN K2) 100 MCG CAPS     aspirin  81 MG  tablet Take 1 tablet (81 mg total) by mouth daily    Calcium  Carbonate (CALCIUM  600 PO) Take by mouth       cholecalciferol (VITAMIN D) 1000 UNIT tablet Take 1 tablet (1,000 units total) by mouth daily.    methylPREDNISolone  (MEDROL  PAK) 4 MG tablet pack Take according to package directions (6 day supply) (Patient not taking: Reported on 08/05/2023)    predniSONE  (DELTASONE ) 5 mg tablet Take 1 tablet (5 mg total) by mouth daily. (Patient not taking: Reported on 10/29/2022)    nitroglycerin  (NITROSTAT ) 0.4 MG SL tablet Place 1 tablet (0.4 mg total) under the tongue every 5 minutes as needed for Chest pain   May repeat 2 times then call 911 if pain persists. (Patient not taking: Reported on 01/23/2022)        PAST MEDICAL HISTORY: Reviewed and updated as needed.   FAMILY HISTORY: Reviewed and updated as needed.   SOCIAL HISTORY: Reviewed and updated as needed.     REVIEW of SYSTEMS:  Rheumatology Review of Systems:    PHYSICAL EXAMINATION:  BP 118/74   Pulse 78   Temp 36.3 C (97.3 F) (Temporal)   Resp 16   Ht 1.626 m (5' 4)   Wt 68.9 kg (152 lb)   BMI 26.09 kg/m   Body mass index is 26.09 kg/m.  Pain    08/05/23 0932   PainSc:   3   PainLoc: Back     Physical Exam  Hands  have no gross stigmata consistent with RA.  No synovitis or joint tenderness reported overlying MCP or IP joints.  Wrist exhibit mild limitation of motion greater on left than right but no swelling noted.  Elbows are unremarkable without flexion contractures, nodules, etc.  Shoulder passive range of motion is painless and within normal limits.  Both knees have a crepitation more prominently on the left than right.  There is no detectable joint effusion, synovitis or warmth.  There was mild medial joint space tenderness of the left knee.  Trace mild peripheral edema seen about the lower extremities.      PRIOR STUDIES:  Labs: - Recent labs reviewed -- please see ERecord results review section for values.     Radiology:   -No recent imaging  results.       Assessment   IMPRESSION:    ICD-10-CM ICD-9-CM    1. Rheumatoid arthritis with positive rheumatoid factor, involving unspecified site  M05.9 714.0 CBC      Comprehensive metabolic panel      CBC      Comprehensive metabolic panel      Sedimentation rate, automated      Cyclic citrullinated peptide      Rheumatoid factor,screen      TB AG t-cell stimulation      2. Osteopenia, unspecified location  M85.80 733.90       3. Other secondary osteoarthritis of left knee  M17.5 715.26       4. Coronary artery disease, unspecified vessel or lesion type, unspecified whether angina present, unspecified whether native or transplanted heart  I25.10 414.00             RECOMMENDATIONS:    Rheumatoid arthritis.  Doing very well Actemra  as monotherapy.  No deviations intended.  No active synovitis noted on examination today.  Continue routine laboratory monitoring with QuantiFERON assay pending in 3 months.  Repeat rheumatoid serologies/acute phase reactants prior to intended follow-up in 6 months.  Osteopenia.  Present continues on strontium.  We will repeat BMD in April 2026.  Again, her definition of osteoporosis stems from T-score at the forearm however FRAX assessment remains within normal limits.  Continue supplemental vitamin D and calcium  as well.  Secondary osteoarthritis left knee.  Continues to do well following previous injection of several months ago.  Not requesting injection today.  Continue a course of observation and inject on a as needed basis.  Did discuss and advise and recommend quadricep strengthening exercises today.  Coronary artery disease.  Stable with no recent changes in medical therapy, hospitalizations, etc.  RTC 6 months.

## 2023-08-13 LAB — COMPREHENSIVE METABOLIC PANEL
ALT: 27 U/L (ref 10–49)
AST: 22 U/L (ref 0–34)
Albumin: 3.5 g/dL (ref 3.4–5.0)
Alk Phos: 81 U/L (ref 46–116)
Anion Gap: 5.2 (ref 4–13)
Bilirubin,Total: 1.2 mg/dL (ref 0.2–1.2)
CO2: 29.8 mmol/L (ref 20.0–31.0)
Calcium: 9.6 mg/dL (ref 8.7–10.4)
Chloride: 110 mmol/L (ref 98–110)
Creatinine: 0.86 mg/dL (ref 0.55–1.02)
Glomerular Filtration Rate: 68.6 mL/min (ref 60–999)
Glucose: 79 mg/dL (ref 74–106)
Lab: 19 mg/dL (ref 9.0–23.0)
Potassium: 4.1 mmol/L (ref 3.5–5.1)
Sodium: 145 mmol/L (ref 136–145)
Total Protein: 5.8 g/dL (ref 5.7–8.2)

## 2023-08-13 LAB — SEDIMENTATION RATE, AUTOMATED: Sedimentation Rate: <1 mm/h (ref 0–33)

## 2023-08-13 LAB — CBC
Baso # K/uL: 0.1 10*3/uL (ref 0.0–0.2)
Basophil %: 1.3 % (ref 0–2)
Eos # K/uL: 0.1 10*3/uL (ref 0.0–0.6)
Eosinophil %: 2.3 % (ref 0–7)
Hematocrit: 44 % (ref 37.0–47.0)
Hemoglobin: 14.7 g/dL (ref 12.0–15.0)
Lymph # K/uL: 1.2 10*3/uL (ref 1.0–3.5)
Lymphocyte %: 21.1 % (ref 19–48)
MCH: 31.9 pg (ref 26–34)
MCHC: 33.3 g/dL (ref 32–36)
MCV: 95.7 fL (ref 78.0–100.0)
Mean Platelet Volume: 8.9 fl (ref 7.3–9.3)
Mono # K/uL: 0.5 10*3/uL (ref 0.0–1.0)
Monocyte %: 9 % (ref 3–9)
Neut # K/uL: 3.6 10*3/uL (ref 1.5–6.6)
Platelets: 146 10*3/uL — ABNORMAL LOW (ref 150–450)
RBC: 4.6 M/uL (ref 3.60–5.00)
RDW: 13.5 % (ref 11.5–14.0)
Seg Neut %: 66.3 % (ref 40–74)
WBC: 5.5 10*3/uL (ref 4.0–10.5)
nRBC: 0 % (ref 0.0–2.0)

## 2023-08-27 ENCOUNTER — Ambulatory Visit: Payer: Medicare (Managed Care) | Admitting: Cardiology

## 2023-09-08 ENCOUNTER — Other Ambulatory Visit: Payer: Self-pay

## 2023-09-08 ENCOUNTER — Ambulatory Visit: Payer: Medicare (Managed Care) | Admitting: Cardiology

## 2023-09-08 VITALS — BP 119/82 | HR 72 | Ht 64.0 in | Wt 148.0 lb

## 2023-09-08 DIAGNOSIS — Z789 Other specified health status: Secondary | ICD-10-CM

## 2023-09-08 DIAGNOSIS — Z9861 Coronary angioplasty status: Secondary | ICD-10-CM

## 2023-09-08 DIAGNOSIS — I2510 Atherosclerotic heart disease of native coronary artery without angina pectoris: Secondary | ICD-10-CM

## 2023-09-08 DIAGNOSIS — I872 Venous insufficiency (chronic) (peripheral): Secondary | ICD-10-CM

## 2023-09-08 LAB — EKG 12-LEAD
P: 48 deg
PR: 128 ms
QRS: 63 deg
QRSD: 83 ms
QT: 423 ms
QTc: 436 ms
Rate: 64 {beats}/min
T: 70 deg

## 2023-09-08 NOTE — Progress Notes (Signed)
 Dear Dr. Courtland Ditch:     Terry Harrison, a 73 y.o. Female presents for follow up of coronary artery disease.    HPI: Terry Harrison seems to be doing well overall.  She does not report chest discomfort or shortness of breath with usual level of physical activity.  She does not report palpitations or awareness of irregular heart rhythm.  She denies orthopnea or PND.      She does have bothersome ankle swelling, and tends to get cramps down into the calf region as well as down into the ankles.  She has minimal swelling in the morning but the swelling seems to accumulate pretty quickly during the day.  She has had no skin infections or skin breakdown.  She does report that varicose veins run in the family and her sister has had intervention.    Past Medical History:   Diagnosis Date    Breast cancer 2000    CAD (coronary artery disease) 07/23/2017    Rheumatoid arthritis 07/2011     Past Surgical History:   Procedure Laterality Date    CARDIAC CATHETERIZATION N/A 07/23/2017    Procedure: Coronary Angio with Possible PTCA;  Surgeon: Marlow Sin, MD;  Location: [facility] CARDIAC CATH LABS;  Service: Cardiovascular    CARDIAC CATHETERIZATION N/A 07/23/2017    Procedure: Stent DES - Coronary;  Surgeon: Marlow Sin, MD;  Location: [facility] CARDIAC CATH LABS;  Service: Cardiovascular    KNEE ARTHROSCOPY Left 2014    knee arthroscopy Right 2000    MASTECTOMY, MODIFIED RADICAL Left 2000    PR CORRECTION HAMMERTOE Right 08/01/2015    Procedure: TOE HAMMERTOE CORRECTION 2nd toe;  Surgeon: Bonnee Bustle, DPM;  Location: [facility];  Service: Podiatry    PR CORRJ HLX VLGS BNCTY SESMDC DSTL METAR OSTEOT Right 08/01/2015    Procedure: CHEVRON BUNIONECTOMY;  Surgeon: Bonnee Bustle, DPM;  Location: [facility];  Service: Podiatry    PR ENDOLUMINAL CORONARY IVUS OCT I&R INITIAL VESSEL N/A 07/23/2017    Procedure: Intravascular Ultrasound;   Surgeon: Marlow Sin, MD;  Location: [facility] CARDIAC CATH LABS;  Service: Cardiovascular    PR RT/LT HEART CATHETERS N/A 07/23/2017    Procedure: Percutaneous Coronary Intervention;  Surgeon: Marlow Sin, MD;  Location: [facility] CARDIAC CATH LABS;  Service: Cardiovascular       ROS: 10 point review of systems otherwise negative.    Allergies: Contac cold+flu night [dm-doxylamine-acetaminophen ] and Hydroxychloroquine    Medications:  Patient's Medications   New Prescriptions    No medications on file   Previous Medications    ASCORBIC ACID  (VITAMIN C  PO)    Take 1,000 mg by mouth daily    ASPIRIN  81 MG TABLET    Take 1 tablet (81 mg total) by mouth daily    ATORVASTATIN  (LIPITOR) 20 MG TABLET    TAKE ONE TABLET BY MOUTH ONCE DAILY    CALCIUM  CARBONATE (CALCIUM  600 PO)    Take by mouth       CHOLECALCIFEROL (VITAMIN D) 1000 UNIT TABLET    Take 1 tablet (1,000 units total) by mouth daily.    MENAQUINONE-7 (VITAMIN K2) 100 MCG CAPS        NITROGLYCERIN  (NITROSTAT ) 0.4 MG SL TABLET    Place 1 tablet (0.4 mg total) under the tongue every 5 minutes as needed for Chest pain   May repeat 2 times then call 911 if pain persists.    PROBIOTIC    Take by mouth.    STRONTIUM CHLORIDE CRYS  By no specified route  680MG  TABLETS    TOCILIZUMAB  (ACTEMRA ) 162 MG/0.9ML PREFILLED SYRINGE    inject subcutaneously 1 syringe every  week based on current weight (thru company)   Modified Medications    No medications on file   Discontinued Medications    METHYLPREDNISOLONE  (MEDROL  PAK) 4 MG TABLET PACK    Take according to package directions (6 day supply)    PREDNISONE  (DELTASONE ) 5 MG TABLET    Take 1 tablet (5 mg total) by mouth daily.        Vital Signs:  Blood pressure 119/82, pulse 72, height 1.626 m (5\' 4" ), weight 67.1 kg (148 lb), SpO2 95%.  Body mass index is 25.4 kg/m.     CONSTITUTIONAL:  Pleasant female, conversant, NAD.  NEURO:  Alert and oriented, in no acute distress.  Grossly intact.  HEENT:  Oropharynx clear, mucous  membranes moist, anicteric sclerae  LYMPH:  Supple without lymphadenopathy.     CARDIOVASCULAR:   A comprehensive cardiovascular exam was performed with details as follows:  Regular rate and rhythm, normal S1, S2, no S3 or S4.  There are no murmurs appreciated.  PMI is normal in position and character.  2+ radial and dorsalis pedis pulses bilaterally with mild nonpitting bilateral ankle edema noted, venous varicosities noted right greater than left. JVP is approximately 5 cm above the right atrium.  There are no carotid bruits noted bilaterally.    RESP:  Clear to auscultation bilaterally without wheezes, rales or rhonchi.  GI:  Soft, nontender, normal active bowel sounds, nondistended.  No abdominal bruits noted.  MUSCULOSKELETAL:  No joint effusions or asymmetry.  SKIN:  No rashes or lesions noted.     Cholesterol Profile:  No results found for: "CHOL", "HDL", "LDLC", "TRIG", "[facility]"   Lab Results   Component Value Date    NA 145 07/29/2023    K 4.1 07/29/2023    CL 110 07/29/2023    CO2 29.8 07/29/2023    UN 19.0 07/29/2023    GLU 79 07/29/2023    GFR 68.6 07/29/2023     12 Lead ECG: Normal sinus rhythm 64 bpm.  Normal axis and intervals.    Reason for ECG:  CAD.      Assessment: Terry Harrison is a 73 y.o. Female with history of rheumatoid arthritis and coronary artery disease with previous PCI to the LAD (April 2019).  Her clinical status remains stable, and she does not report symptoms of heart failure or angina.  She does have mild lower extremity venous insufficiency issues including associated symptoms of cramping.    Plan:   1.  Coronary artery disease with previous PCI- She remains free of anginal symptoms.  She will continue the current regimen including atorvastatin  and low-dose aspirin .  Her blood pressure remains very normal.  I did not recommend additional cardiac testing at this time.    2.  Lower extremity venous insufficiency- At her request, I entered a referral to our vascular surgery vein clinic.   We reviewed that she can consider compression stockings.    I will see her in follow-up in 1 year, sooner if needed.    Thank you for the opportunity of sharing in the care of this patient.  We look forward to working with you in the future.    Donn Fury, MD  [facility] Cardiology

## 2023-09-25 ENCOUNTER — Telehealth: Payer: Self-pay

## 2023-09-25 NOTE — Telephone Encounter (Signed)
 1st Attempt- I called the patient to schedule a NPV. I left a VM for the patient to call the office to schedule.

## 2023-10-30 ENCOUNTER — Other Ambulatory Visit: Payer: Self-pay | Admitting: Rheumatology

## 2023-10-30 LAB — CBC
Baso # K/uL: 0 K/mm3 (ref 0.0–0.2)
Basophil %: 0.6 % (ref 0–2)
Eos # K/uL: 0.1 K/mm3 (ref 0.0–0.6)
Eosinophil %: 1.8 % (ref 0–7)
Hematocrit: 43.2 % (ref 37.0–47.0)
Hemoglobin: 14.6 g/dL (ref 12.0–15.0)
Lymph # K/uL: 1.3 K/mm3 (ref 1.0–3.5)
Lymphocyte %: 20.6 % (ref 19–48)
MCH: 32.2 pg (ref 26–34)
MCHC: 33.7 g/dL (ref 32–36)
MCV: 95.5 fL (ref 78.0–100.0)
Mean Platelet Volume: 9.5 fl — ABNORMAL HIGH (ref 7.3–9.3)
Mono # K/uL: 0.6 K/mm3 (ref 0.0–1.0)
Monocyte %: 8.8 % (ref 3–9)
Neut # K/uL: 4.3 K/mm3 (ref 1.5–6.6)
Platelets: 154 K/mm3 (ref 150–450)
RBC: 4.52 M/uL (ref 3.60–5.00)
RDW: 13 % (ref 11.5–14.0)
Seg Neut %: 68.2 % (ref 40–74)
WBC: 6.3 K/mm3 (ref 4.0–10.5)
nRBC: 0.1 % (ref 0.0–2.0)

## 2023-10-30 LAB — COMPREHENSIVE METABOLIC PANEL
ALT: 27 U/L (ref 10–49)
AST: 19 U/L (ref 0–34)
Albumin: 3.7 g/dL (ref 3.4–5.0)
Alk Phos: 84 U/L (ref 46–116)
Anion Gap: 7 (ref 4–13)
Bilirubin,Total: 1.4 mg/dL — ABNORMAL HIGH (ref 0.2–1.2)
CO2: 30 mmol/L (ref 20.0–31.0)
Calcium: 9.5 mg/dL (ref 8.7–10.4)
Chloride: 104 mmol/L (ref 98–110)
Creatinine: 0.97 mg/dL (ref 0.55–1.02)
Glomerular Filtration Rate: 59.7 mL/min — ABNORMAL LOW (ref 60–999)
Glucose: 76 mg/dL (ref 74–106)
Lab: 22 mg/dL (ref 9.0–23.0)
Potassium: 4 mmol/L (ref 3.5–5.1)
Sodium: 141 mmol/L (ref 136–145)
Total Protein: 6 g/dL (ref 5.7–8.2)

## 2023-10-30 LAB — RHEUMATOID FACTOR,QUANT: RF Quant: 196.8 [IU]/mL

## 2023-10-30 LAB — SEDIMENTATION RATE, AUTOMATED: Sedimentation Rate: <1 mm/h (ref 0–33)

## 2023-11-02 LAB — CYCLIC CITRULLINATED PEPTIDE: Cyclic Citrullin Peptide Ab: >250 U — AB

## 2023-11-03 LAB — QUANTIFERON TB GOLD PLUS
Quantiferon Mitogen-NIL: 3.92 [IU]/mL
Quantiferon NIL: 0.01 [IU]/mL
Quantiferon TB Gold Plus: NEGATIVE
Quantiferon+ TB1 Minus NIL: 0 [IU]/mL
Quantiferon+ TB2 Minus NIL: 0 [IU]/mL

## 2024-01-01 ENCOUNTER — Encounter: Payer: Self-pay | Admitting: Vascular Surgery

## 2024-01-01 ENCOUNTER — Ambulatory Visit: Payer: Medicare (Managed Care) | Attending: Vascular Surgery | Admitting: Vascular Surgery

## 2024-01-01 ENCOUNTER — Other Ambulatory Visit: Payer: Self-pay

## 2024-01-01 VITALS — BP 130/88 | Ht 64.0 in | Wt 152.0 lb

## 2024-01-01 DIAGNOSIS — M79605 Pain in left leg: Secondary | ICD-10-CM

## 2024-01-01 DIAGNOSIS — M79604 Pain in right leg: Secondary | ICD-10-CM

## 2024-01-07 ENCOUNTER — Encounter: Payer: Self-pay | Admitting: Vascular Surgery

## 2024-01-07 NOTE — Progress Notes (Signed)
 Thank you for referring Terry Harrison to the [facility]. Terry Harrison is a pleasant 73 y.o. female with a several year history of nighttime calf cramping  involving the bilateral lower extremities. Subjective: Terry Harrison's legs are not heavy, tired, fatigued, and aching throughout the afternoon in a way that is indicative of venous insufficiency. Terry Harrison notes that her bothersome symptom is nighttime calf cramping that has been going on for many years.  She notes that this seems to present when her knees are bent at night.  There are no symptoms typical for rest pain and there is no tissue loss noted. Elevation, rest, anti-inflammatory medication, avoidance of sitting or standing for greater than 30 minutes at a time, avoidance of leg crossing and exercise have not been attempted in the past.  Compression therapy has not been used. Terry Harrison has no preceding history of edema, trauma, or hypercoagulable state.  History of superficial phlebitis: no. History of PE, No History of DVT: no.  Prior varicose vein treatment on the RIGHT leg: No.  Prior varicose vein treatment on the LEFT leg: No.  Current anticoagulant: NoneNumber of pregnancies: unknownFamily history of vein disease: noPatient Reported Symptoms:RIGHT ozh:Yzjcpwzdd: None of the timeAchiness: A little of the timeSwelling: A good bit of the timeThrobbing: None of the timeItching: A little of the timeRight Leg VVSYMQ Score: 5Vein appearance: Not at all noticeableImpact of RIGHT leg symptoms on ability to work/perform usual daily activities over the past week: No effectLEFT ozh:Yzjcpwzdd: None of the timeAchiness: A little of the timeSwelling: Some of the timeThrobbing: None of the timeItching: A little of the timeLeft Leg VVSYMQ Score: 4Vein appearance: Not at all noticeableImpact of LEFT leg symptoms on ability to work/perform usual daily activities over the past week: No effectNumber of days  after procedure before patient could resume work/normal activity: N/A - no procedure performed at this timeReview of Systems:Pertinent items are noted in HPI.Past Medical History[1] Past Surgical History[2] Family History[3]Social History Tobacco Use  Smoking status: Never  Smokeless tobacco: Never Substance Use Topics  Alcohol use: Yes   Comment: OCCASIONAL Allergies[4] Current Outpatient Medications Medication  probiotic  tocilizumab  (ACTEMRA ) 162 MG/0.9ML prefilled syringe  atorvastatin  (LIPITOR) 20 mg tablet  Ascorbic Acid  (VITAMIN C  PO)  Strontium Chloride CRYS  Menaquinone-7 (VITAMIN K2) 100 MCG CAPS  aspirin  81 MG tablet  Calcium  Carbonate (CALCIUM  600 PO)  cholecalciferol (VITAMIN D) 1000 UNIT tablet  nitroglycerin  (NITROSTAT ) 0.4 MG SL tablet No current facility-administered medications for this visit. Objective: Physical Exam:General appearance: alert, appears stated age, cooperative and no distressHead: Normocephalic, without obvious abnormality, atraumaticHeart: regular rate and rhythmLungs: CTAExtremities: extremities normal, atraumatic, no cyanosis, redness or tenderness in the calves or thighs, no ulcers, gangrene, or trophic changes.  Trace edema.Neurologic: Grossly normalVitals: Vitals:  01/01/24 1039 BP: 130/88 Weight: 68.9 kg (152 lb) Height: 1.626 m (5' 4) Pulses:Right Dorsalis Pedis: NormalLeft Dorsalis Pedis: NormalCEAP/VCSS:RIGHT Leg CEAP/VCSS:Class: C3Pain: Mild (occasional, non-restrictive)Varicose Veins: NoneEdema: Mild (limited to foot and ankle areas)Pigmentation: NoneInflammation: NoneInduration: NoneActive Ulcers: NoneUlcer Duration: NoneLargest Diameter: NoneCompression Rx: NoneTotal: 2LEFT Leg CEAP/VCSS:Class: C3Pain: Mild (occasional, non-restrictive)Varicose Veins: NoneEdema: Mild (limited to foot and ankle areas)Pigmentation:  NoneInflammation: NoneInduration: NoneActive Ulcers: NoneUlcer Duration: NoneLargest Diameter: NoneCompression Rx: NoneTotal: 2Non-invasive Studies:RIGHT leg: None  LEFT ozh:Wnwz  Assessment: Terry Harrison is a 73 y.o.-year old with nighttime calf cramping involving the bilateral lower extremities.Plan: Discussed with patient that nighttime calf cramping/charley horse pain is not a typical symptom for a vascular etiology.  Discussed symptoms of venous insufficiency/PAD and  patient denies all symptoms typical for these diagnoses.  Discussed that nighttime cramping is most typically related to hydration level or K or Mg levels Recommend continued follow up with PCP RTC on an as needed basisContact clinic with any questions or concerns Nat Petty, NP 01/07/2024  [1] Past Medical History:Diagnosis Date  Breast cancer 2000  CAD (coronary artery disease) 07/23/2017  Rheumatoid arthritis 07/2011 [2] Past Surgical History:Procedure Laterality Date  CARDIAC CATHETERIZATION N/A 07/23/2017  Procedure: Coronary Angio with Possible PTCA;  Surgeon: Vista Bruckner, MD;  Location: [facility] CARDIAC CATH LABS;  Service: Cardiovascular  CARDIAC CATHETERIZATION N/A 07/23/2017  Procedure: Stent DES - Coronary;  Surgeon: Vista Bruckner, MD;  Location: [facility] CARDIAC CATH LABS;  Service: Cardiovascular  KNEE ARTHROSCOPY Left 2014  knee arthroscopy Right 2000  MASTECTOMY, MODIFIED RADICAL Left 2000  PR CORRECTION HAMMERTOE Right 08/01/2015  Procedure: TOE HAMMERTOE CORRECTION 2nd toe;  Surgeon: Patti Lenis, DPM;  Location: [facility];  Service: Podiatry  PR CORRJ HLX VLGS BNCTY SESMDC DSTL METAR OSTEOT Right 08/01/2015  Procedure: CHEVRON BUNIONECTOMY;  Surgeon: Patti Lenis, DPM;  Location: [facility];  Service: Podiatry  PR ENDOLUMINAL CORONARY IVUS OCT I&R INITIAL VESSEL N/A 07/23/2017  Procedure: Intravascular Ultrasound;   Surgeon: Vista Bruckner, MD;  Location: [facility] CARDIAC CATH LABS;  Service: Cardiovascular  PR RT/LT HEART CATHETERS N/A 07/23/2017  Procedure: Percutaneous Coronary Intervention;  Surgeon: Vista Bruckner, MD;  Location: [facility] CARDIAC CATH LABS;  Service: Cardiovascular [3] History reviewed. No pertinent family history.[4] AllergiesAllergen Reactions  Contac Cold+Flu Night [Dm-Doxylamine-Acetaminophen ] Hives  Hydroxychloroquine Hives and Rash   Other reaction(s): rash, pruritic, 4 weeks after Rx-start

## 2024-01-20 ENCOUNTER — Encounter: Payer: Self-pay | Admitting: Gastroenterology

## 2024-01-22 ENCOUNTER — Other Ambulatory Visit: Payer: Self-pay | Admitting: Cardiology

## 2024-01-22 NOTE — Telephone Encounter (Signed)
 Last OV: 6/3/2025FUV sched:   1 year  Lab results: 07/25/251120 04/23/250851 Sodium 141 145 Potassium 4.0 4.1 Chloride 104 110 CO2 30.0 29.8 UN 22.0 19.0 Creatinine 0.97 0.86 Glucose 76 79 Calcium  9.5 9.6 Total Protein 6.0 5.8 Albumin 3.7 3.5 AST 19 22 Alk Phos 84 81 Bilirubin,Total 1.4* 1.2 No results for input(s): CHOL, HDL, LDLC, LDL, TRIG, NHDLC, CHHDC in the last 8760 hours.

## 2024-02-03 ENCOUNTER — Ambulatory Visit: Payer: Medicare (Managed Care) | Attending: Rheumatology | Admitting: Rheumatology

## 2024-02-03 ENCOUNTER — Encounter: Payer: Self-pay | Admitting: Rheumatology

## 2024-02-03 ENCOUNTER — Other Ambulatory Visit: Payer: Self-pay

## 2024-02-03 VITALS — BP 122/70 | HR 68 | Temp 97.2°F | Resp 16 | Ht 64.0 in | Wt 152.5 lb

## 2024-02-03 DIAGNOSIS — M059 Rheumatoid arthritis with rheumatoid factor, unspecified: Secondary | ICD-10-CM | POA: Insufficient documentation

## 2024-02-03 DIAGNOSIS — M8580 Other specified disorders of bone density and structure, unspecified site: Secondary | ICD-10-CM | POA: Insufficient documentation

## 2024-02-03 DIAGNOSIS — M175 Other unilateral secondary osteoarthritis of knee: Secondary | ICD-10-CM | POA: Insufficient documentation

## 2024-02-03 DIAGNOSIS — I2510 Atherosclerotic heart disease of native coronary artery without angina pectoris: Secondary | ICD-10-CM | POA: Insufficient documentation

## 2024-02-03 NOTE — Progress Notes (Signed)
 Rheumatology Follow-Up Note PRIMARY CARE PHYSICIAN: Reatha Soman, MDSubjective HPI: Terry Harrison is a 73 y.o. year old female who is here for follow up of rheumatoid arthritis.  [name] comes in today for routine evaluation once again doing extremely well on a regimen of Actemra  subcutaneous 162 mg weekly.  She reports no interruptions by virtue infectious complications or other medical concerns.  She still reports intermittent knee pain which has a strong nocturnal predilection.  However, she reports no knee discomfort with daily life activities such as walking, going up and down stairs, etc.  We injected her left knee greater than a year ago with good clinical improvement.  She also reports intermittent right ankle pain which has been a new finding as of late.  She is also concerned about a trigger finger affecting the fourth digit of the right hand.  She does report a prior history of trigger finger injection in the distant past.  No new medical problems otherwise are identified.  Laboratory assessments reviewed reflect a normal CBC and metabolic profile.  Acute phase reactants remain normal.  With respect to her history of osteopenia she continues on a homeopathic remedy of strontium.  Last BMD reviewed reflected modest clinical improvement in bone mineralization.  She has been reluctant to consider pharmacologic interventions for her osteoporosis.  She continues on appropriate amounts of supplemental vitamin D and calcium  and reports no falls or fracture events.  She will be off to [place]  this winter.Current Meds:Current Outpatient Medications Medication Sig  cyanocobalamin 1000 MCG tablet Take 1 tablet (1,000 mcg total) by mouth daily.  magnesium  gluconate (MAGONATE) 500 MG tablet Take 1 tablet (500 mg total) by mouth daily.  atorvastatin  (LIPITOR) 20 mg tablet TAKE ONE TABLET BY MOUTH ONCE DAILY  probiotic Take by mouth.  tocilizumab  (ACTEMRA ) 162 MG/0.9ML prefilled  syringe inject subcutaneously 1 syringe every  week based on current weight (thru company)  Ascorbic Acid  (VITAMIN C  PO) Take 1,000 mg by mouth daily  Strontium Chloride CRYS By no specified route  680MG  TABLETS  Menaquinone-7 (VITAMIN K2) 100 MCG CAPS   aspirin  81 MG tablet Take 1 tablet (81 mg total) by mouth daily  Calcium  Carbonate (CALCIUM  600 PO) Take by mouth     cholecalciferol (VITAMIN D) 1000 UNIT tablet Take 1 tablet (1,000 units total) by mouth daily.  nitroglycerin  (NITROSTAT ) 0.4 MG SL tablet Place 1 tablet (0.4 mg total) under the tongue every 5 minutes as needed for Chest pain   May repeat 2 times then call 911 if pain persists.  PAST MEDICAL HISTORY: Reviewed and updated as needed. FAMILY HISTORY: Reviewed and updated as needed. SOCIAL HISTORY: Reviewed and updated as needed. REVIEW of SYSTEMS:Rheumatology Review of Systems:PHYSICAL EXAMINATION:BP 122/70   Pulse 68   Temp 36.2 C (97.2 F) (Temporal)   Resp 16   Ht 1.626 m (5' 4)   Wt 69.2 kg (152 lb 8 oz)   BMI 26.18 kg/m Body mass index is 26.18 kg/m.Pain  02/03/24 0940 PainSc:   2 PainLoc: Foot Physical ExamHands have no gross stigmata consistent with RA.  No synovitis no joint tenderness seen overlying MCP or IP joints.  Wrist exhibit mild limitation of dorsiflexion greater on right than left but this is very minimal in nature.  No objective swelling, synovitis or effusion is detected.  Elbows exhibit no flexion contractures or nodularity.  Shoulder passive range of motion was painless and within normal limits.  Right knee exhibits prominent bony deformities and medial joint space tenderness.  Left knee exhibits a minimal cool effusion.  Both knees exhibit crepitation with passive maneuvers but full range of motion.  Ankles exhibit well-preserved subtalar and tibiotalar motion without swelling or effusion.  No joint tenderness expressed with palpation today.  Feet exhibit hammertoe  deformities and previous surgical interventions however no synovitis nor joint tenderness noted.  No significant peripheral edema nor rashes identified.PRIOR STUDIES:Labs: - Recent labs reviewed -- please see ERecord results review section for values. Radiology:   -No recent imaging results. Assessment IMPRESSION:  ICD-10-CM ICD-9-CM  1. Rheumatoid arthritis with positive rheumatoid factor, involving unspecified site  M05.9 714.0 CBC and differential    Comprehensive metabolic panel    Sedimentation rate, automated    CBC and differential    Comprehensive metabolic panel    Sedimentation rate, automated  2. Osteopenia, unspecified location  M85.80 733.90   3. Coronary artery disease, unspecified vessel or lesion type, unspecified whether angina present, unspecified whether native or transplanted heart  I25.10 414.00   4. Other secondary osteoarthritis of left knee  M17.5 715.26   RECOMMENDATIONS:  Rheumatoid arthritis.  By all accounts doing well with Actemra  as monotherapy.  No deviations intended.  I have arranged laboratory assessments for 3 months and 6 months while in [place] .  No new medical problems identified or interruptions in Actemra  by virtue of infectious complications, etc.  QuantiFERON assay will be addressed in the spring 2026.Osteopenia.  Continue with homeopathic regiment of strontium.  Intention to repeat bone density in the spring 2026 upon her return from [place] .  This will be addressed at our follow-up evaluation.Coronary artery disease.  Stable per recent cardiology evaluations.  No new clinical events or changes in medical therapy.Osteoarthritis of both knees.  Clinically doing well despite pronounced radiographic changes consistent with severe tricompartmental arthritis of the right knee.  No significant limitations in life expressed.  Continue our course of observation and inject as needed.RTC 6 months.

## 2024-02-03 NOTE — Patient Instructions (Signed)
 Our office does not get notified of insurance changes automatically. You are required to notify this office immediately if there have been any insurance changes that could effect your current treatment. You should also let the office know if you have a separate  or specialty drug Prescription coverage. Failure to do so could delay your care. Please let us  know if you have a prescription coverage besides your regular insurance.If you are experiencing any of the following; current illness, fever, start of antibiotics in the last 48 hours, pending surgery or any wounds, you are required to contact our office as soon as possible as this could potentially effect your scheduled infusion. We also encourage all patients to sign up for MyChart.  This allows for appointment reminders, communication and can allow for TeleHealth visits, if one is necessary. You may receive a survey regarding your visit today with us . Please take the time to fill it out as we appreciate the feedback.

## 2024-02-11 ENCOUNTER — Telehealth: Payer: Self-pay

## 2024-02-11 ENCOUNTER — Other Ambulatory Visit: Payer: Self-pay

## 2024-02-12 ENCOUNTER — Other Ambulatory Visit: Payer: Self-pay

## 2024-02-12 DIAGNOSIS — M069 Rheumatoid arthritis, unspecified: Secondary | ICD-10-CM

## 2024-02-12 MED ORDER — ACTEMRA 162 MG/0.9ML SC SOSY
PREFILLED_SYRINGE | SUBCUTANEOUS | 2 refills | Status: AC
Start: 2024-02-12 — End: ?

## 2024-05-06 ENCOUNTER — Telehealth: Payer: Self-pay

## 2024-05-06 ENCOUNTER — Other Ambulatory Visit: Payer: Self-pay

## 2024-05-06 MED ORDER — ATORVASTATIN CALCIUM 20 MG PO TABS *I*: 20.0000 mg | ORAL_TABLET | Freq: Every day | ORAL | 3 refills | Status: AC

## 2024-05-06 NOTE — Telephone Encounter (Signed)
 Person making request: Name of caller: PharmacyLast OV/Telemedicine visit date: 6/3/2025Next visit date if scheduled:  Send prescription un:[id number]  Pharmacy Name and Location: [id number] - [place], [place] - [address] (Pharmacy) Enter Medication Name, Current Dose, Frequency and 30 day or 90 day Supply for Each Medication Requestedatorvastatin (LIPITOR) 20 mg tablet  Additional info/recent lab results if applicable:

## 2024-05-09 ENCOUNTER — Telehealth: Payer: Self-pay

## 2024-05-09 NOTE — Telephone Encounter (Signed)
 Patient called stating that she gets labs done every 3 months and was due to have them around 05/05/24.  Patient states that she broke her left wrist and had surgery on 05/04/24 and states that it is healing ok.  Patient asked when she should get her next labs done.

## 2024-08-03 ENCOUNTER — Ambulatory Visit: Payer: Medicare (Managed Care) | Admitting: Rheumatology
